# Patient Record
Sex: Male | Born: 1975 | Hispanic: No | Marital: Married | Smoking: Former smoker
Health system: Southern US, Community
[De-identification: ages and names within clinical notes are randomized; demographics above are authoritative.]

## PROBLEM LIST (undated history)

## (undated) ENCOUNTER — Ambulatory Visit: Discharge status: Absent without leave | Payer: Managed Care, Other (non HMO)

## (undated) DIAGNOSIS — E119 Type 2 diabetes mellitus without complications: Secondary | ICD-10-CM

## (undated) HISTORY — PX: KNEE ARTHROPLASTY: SHX992

---

## 2017-12-28 LAB — LIPID PANEL
Cholesterol: 197 (ref 0–200)
HDL: 39 (ref 35–70)
LDL Cholesterol: 117
Triglycerides: 207 — AB (ref 40–160)

## 2017-12-28 LAB — TSH: TSH: 4.1 (ref 0.41–5.90)

## 2017-12-28 LAB — BASIC METABOLIC PANEL
BUN: 16 (ref 4–21)
CO2: 24 — AB (ref 13–22)
Chloride: 100 (ref 99–108)
Creatinine: 0.9 (ref 0.6–1.3)
Glucose: 157
Potassium: 4.3 (ref 3.4–5.3)
Sodium: 142 (ref 137–147)

## 2017-12-28 LAB — COMPREHENSIVE METABOLIC PANEL
Albumin: 4.7 (ref 3.5–5.0)
Calcium: 9.4 (ref 8.7–10.7)
GFR calc non Af Amer: 104
Globulin: 2.3

## 2017-12-28 LAB — HEPATIC FUNCTION PANEL
ALT: 26 (ref 10–40)
AST: 16 (ref 14–40)
Alkaline Phosphatase: 68 (ref 25–125)
Bilirubin, Total: 0.8

## 2017-12-28 LAB — CBC AND DIFFERENTIAL
HCT: 48 (ref 41–53)
Hemoglobin: 16 (ref 13.5–17.5)
Platelets: 235 (ref 150–399)
WBC: 5.9

## 2017-12-28 LAB — CBC: RBC: 5.43 — AB (ref 3.87–5.11)

## 2017-12-28 LAB — TESTOSTERONE: Testosterone: 293

## 2019-12-13 LAB — HEMOGLOBIN A1C: Hemoglobin A1C: 11.3

## 2020-06-28 ENCOUNTER — Ambulatory Visit: Payer: Managed Care, Other (non HMO) | Admitting: Family Medicine

## 2020-06-28 ENCOUNTER — Other Ambulatory Visit: Payer: Self-pay

## 2020-06-28 ENCOUNTER — Other Ambulatory Visit: Payer: Self-pay | Admitting: Family Medicine

## 2020-06-28 ENCOUNTER — Encounter: Payer: Self-pay | Admitting: Family Medicine

## 2020-06-28 VITALS — BP 133/84 | HR 99 | Ht 68.0 in | Wt 214.8 lb

## 2020-06-28 DIAGNOSIS — E1169 Type 2 diabetes mellitus with other specified complication: Secondary | ICD-10-CM | POA: Diagnosis not present

## 2020-06-28 DIAGNOSIS — Z7689 Persons encountering health services in other specified circumstances: Secondary | ICD-10-CM | POA: Diagnosis not present

## 2020-06-28 DIAGNOSIS — E669 Obesity, unspecified: Secondary | ICD-10-CM | POA: Insufficient documentation

## 2020-06-28 DIAGNOSIS — E785 Hyperlipidemia, unspecified: Secondary | ICD-10-CM | POA: Diagnosis not present

## 2020-06-28 DIAGNOSIS — E119 Type 2 diabetes mellitus without complications: Secondary | ICD-10-CM | POA: Insufficient documentation

## 2020-06-28 MED ORDER — TRULICITY 0.75 MG/0.5ML ~~LOC~~ SOAJ: 0.7500 mg | SUBCUTANEOUS | 1 refills | Status: DC

## 2020-06-28 NOTE — Assessment & Plan Note (Signed)
History of elevated lipids, on statin temporarily Request records No recent lab  Plan: Encourage improved lifestyle - low carb/cholesterol, reduce portion size, continue improving regular exercise  F/u labs after review records in future

## 2020-06-28 NOTE — Assessment & Plan Note (Signed)
History of variable controlled DM, recent CBGs moderately controlled No recent T6L Complications - hyperlipidemia, obesity  Plan:  1. NEW start GLP1 Trulicity 4.65KP weekly inj - sample x2 pens today coupon copay card, new rx sent 90 day to pharmacy. Goal for wt loss appetite suppression as well 2. Continue Jardiance 26m existing med for now for first few days, if fasting sugars lower can phase off jardiance 3. Keep metformin XR 10055mBID or 2000 daily - adjust dose in future 4. Encourage improved lifestyle - low carb, low sugar diet, reduce portion size, continue improving regular exercise 5. Check CBG , bring log to next visit for review Future urine microalbumin foot exam, diabetes eye exam refer

## 2020-06-28 NOTE — Telephone Encounter (Signed)
Requested medication (s) are due for refill today: Yes  Requested medication (s) are on the active medication list: Yes  Last refill:  06/28/20  Future visit scheduled: Yes  Notes to clinic:  See highlighted notes from pharmacy     Requested Prescriptions  Pending Prescriptions Disp Refills   [place] 2 MG PEN [Pharmacy Med Name: BYDUREON 2 MG PEN INJECT]  0      Endocrinology:  Diabetes - GLP-1 Receptor Agonists - exenatide Failed - 06/28/2020  4:26 PM      Failed - HBA1C is between 0 and 7.9 and within 180 days    No results found for: HGBA1C, LABA1C        Failed - Cr in normal range and within 360 days    No results found for: CREATININE, LABCREAU, LABCREA, POCCRE        Failed - eGFR in normal range and within 360 days    No results found for: GFRAA, GFRNONAA, GFR, EGFR        Passed - Valid encounter within last 6 months    Recent Outpatient Visits           Today Type 2 diabetes mellitus with other specified complication, without long-term current use of insulin ([place])   [facility] Parks Ranger, Devonne Doughty, DO       Future Appointments             In 3 months Parks Ranger, Devonne Doughty, [facility], [facility]

## 2020-06-28 NOTE — Patient Instructions (Addendum)
Thank you for coming to the office today.  New rx sample Trulicity 6.15HI once weekly, use 2 samples, can stay out for 14 days at room temp  Disposal in sharps container.  New rx sent to pharmacy for 90 day supply, use the coupon in the sample to activate it for discount  Benefits are - reduced appetite, weight loss, sugar control, and cardiovascular protection  Can switch to ozempic if need  After 2-4 days can consider stopping Jardiance if sugars are doing good, goal is <150 fasting AM sugar.  May need jardiance for longer, until adjusted.  Eventually can reduce meds and keep the weekly injection.   Please schedule a Follow-up Appointment to: Return in about 3 months (around 09/27/2020) for 3 month follow-up DM A1c,Urine Microalbumin Foot exam, med adjust.  If you have any other questions or concerns, please feel free to call the office or send a message through [place]. You may also schedule an earlier appointment if necessary.  Additionally, you may be receiving a survey about your experience at our office within a few days to 1 week by e-mail or mail. We value your feedback.  Nobie Putnam, DO [place]

## 2020-06-28 NOTE — Progress Notes (Signed)
Subjective:    Patient ID: Cory Horne, male    DOB: [birth year], 45 y.o.   MRN: [id number]  Cory Horne is a 45 y.o. male presenting on 06/28/2020 for [place] and Diabetes  Moved from [place]. He still works in [employer]. Previous doctor in [place]. [employer].  HPI   CHRONIC DM, Type 2: Reports prior diagnosis over past few years. He has not been able to manage it. He says in past was diet controlled and then was out of control, had been to [place] before intermittently and not always adhering to lifestyle. - He developed blurry vision then went to doctor, A1c in past >11 reported, he was started on Farxiga in past, then it was switched to Jardiance and Metformin was added. Overall has improved A1c. CBGs: Avg 130-180, checks CBG occasionally Meds: Jardiance 75m daily, Metformin XR 5052mx 2 = 100034mID Reports good compliance. Tolerating well w/o side-effects Currently not on ACEi / ARB Lifestyle: - Diet (admits problem with inc carb intake and portions)  - Exercise (no regular exercise except regular walking at work, also very active at home) Denies hypoglycemia, polyuria, visual changes, numbness or tingling.   Hyperlipidemia History of mild elevated cholesterol in past. He was on rx Statin medication in past but has come off of this.  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   Depression screen PHQ 2/9 06/28/2020  Decreased Interest 0  Down, Depressed, Hopeless 0  PHQ - 2 Score 0    History reviewed. No pertinent past medical history. History reviewed. No pertinent surgical history. Social History   Socioeconomic History  . Marital status: Not on file    Spouse name: Not on file  . Number of children: Not on file  . Years of education: Not on file  . Highest education level: Not on file  Occupational History  . Not on file  Tobacco Use  . Smoking status: Former Smoker    Types: Cigarettes    Quit date: 2018    Years since  quitting: 4.2  . Smokeless tobacco: Never Used  Substance and Sexual Activity  . Alcohol use: Not Currently  . Drug use: Not on file  . Sexual activity: Not on file  Other Topics Concern  . Not on file  Social History Narrative  . Not on file   Social Determinants of Health   Financial Resource Strain: Not on file  Food Insecurity: Not on file  Transportation Needs: Not on file  Physical Activity: Not on file  Stress: Not on file  Social Connections: Not on file  Intimate Partner Violence: Not on file   History reviewed. No pertinent family history. Current Outpatient Medications on File Prior to Visit  Medication Sig  . JARDIANCE 25 MG TABS tablet Take 25 mg by mouth daily.  . metFORMIN (GLUCOPHAGE-XR) 500 MG 24 hr tablet Take 1,000 mg by mouth 2 (two) times daily.   No current facility-administered medications on file prior to visit.    Review of Systems Per HPI unless specifically indicated above      Objective:    BP 133/84   Pulse 99   Ht 5' 8"  (1.727 m)   Wt 214 lb 12.8 oz (97.4 kg)   SpO2 98%   BMI 32.66 kg/m   Wt Readings from Last 3 Encounters:  06/28/20 214 lb 12.8 oz (97.4 kg)    Physical Exam Vitals and nursing note reviewed.  Constitutional:  General: He is not in acute distress.    Appearance: He is well-developed. He is not diaphoretic.     Comments: Well-appearing, comfortable, cooperative  HENT:     Head: Normocephalic and atraumatic.  Eyes:     General:        Right eye: No discharge.        Left eye: No discharge.     Conjunctiva/sclera: Conjunctivae normal.  Cardiovascular:     Rate and Rhythm: Normal rate.  Pulmonary:     Effort: Pulmonary effort is normal.  Skin:    General: Skin is warm and dry.     Findings: No erythema or rash.  Neurological:     Mental Status: He is alert and oriented to person, place, and time.  Psychiatric:        Behavior: Behavior normal.     Comments: Well groomed, good eye contact, normal  speech and thoughts    No results found for this or any previous visit.    Assessment & Plan:   Problem List Items Addressed This Visit    Obesity (BMI 30.0-34.9)   Hyperlipidemia associated with type 2 diabetes mellitus ([place])    History of elevated lipids, on statin temporarily Request records No recent lab  Plan: Encourage improved lifestyle - low carb/cholesterol, reduce portion size, continue improving regular exercise  F/u labs after review records in future      Relevant Medications   JARDIANCE 25 MG TABS tablet   metFORMIN (GLUCOPHAGE-XR) 500 MG 24 hr tablet   TRULICITY 6.65 LD/3.5TS SOPN   Diabetes mellitus ([place]) - Primary    History of variable controlled DM, recent CBGs moderately controlled No recent V7B Complications - hyperlipidemia, obesity  Plan:  1. NEW start GLP1 Trulicity 9.39QZ weekly inj - sample x2 pens today coupon copay card, new rx sent 90 day to pharmacy. Goal for wt loss appetite suppression as well 2. Continue Jardiance 28m existing med for now for first few days, if fasting sugars lower can phase off jardiance 3. Keep metformin XR 10062mBID or 2000 daily - adjust dose in future 4. Encourage improved lifestyle - low carb, low sugar diet, reduce portion size, continue improving regular exercise 5. Check CBG , bring log to next visit for review Future urine microalbumin foot exam, diabetes eye exam refer      Relevant Medications   JARDIANCE 25 MG TABS tablet   metFORMIN (GLUCOPHAGE-XR) 500 MG 24 hr tablet   TRULICITY 0.0.09GQZ/3.0QTOPN    Other Visit Diagnoses    Encounter to establish care with new doctor            Meds ordered this encounter  Medications  . TRULICITY 0.6.22GQJ/3.3LKOPN    Sig: Inject 0.75 mg into the skin once a week.    Dispense:  6 mL    Refill:  1      Follow up plan: Return in about 3 months (around 09/27/2020) for 3 month follow-up DM A1c,Urine Microalbumin Foot exam, med adjust.  AlNobie Putnam DO [facility] 06/28/2020, 3:25 PM

## 2020-07-02 ENCOUNTER — Other Ambulatory Visit: Payer: Self-pay | Admitting: Family Medicine

## 2020-07-02 DIAGNOSIS — E1169 Type 2 diabetes mellitus with other specified complication: Secondary | ICD-10-CM

## 2020-07-02 MED ORDER — BYDUREON BCISE 2 MG/0.85ML ~~LOC~~ AUIJ: 2.0000 mg | AUTO-INJECTOR | SUBCUTANEOUS | 3 refills | Status: DC

## 2020-07-02 NOTE — Telephone Encounter (Signed)
Pharmacy comment: Alternative Requested:THE PRESCRIBED MEDICATION IS NOT COVERED BY INSURANCE. PLEASE CONSIDER CHANGING TO ONE OF THE SUGGESTED COVERED ALTERNATIVES OR SUBMITTING FOR A PRIOR AUTHORIZATION   Routing to provider

## 2020-07-03 ENCOUNTER — Other Ambulatory Visit: Payer: Self-pay | Admitting: Family Medicine

## 2020-07-03 DIAGNOSIS — E1169 Type 2 diabetes mellitus with other specified complication: Secondary | ICD-10-CM

## 2020-07-03 NOTE — Telephone Encounter (Signed)
PA was completed and approved for the requested medication

## 2020-07-03 NOTE — Telephone Encounter (Signed)
Yes, I called him and he said that the pharmacy had just been in contact with him about the approval.. All good!

## 2020-07-03 NOTE — Telephone Encounter (Signed)
Originally rx Trulicity 6.07PX weekly on 06/28/20, it was declined by pharmacy since not preferred and requested switch to alternate or PA.  I switched to Bydureon because it was listed as alternate.  Now pharmacy sends back the Bydureon says it is not preferred either.  They recommend Trulicity as preferred.  They will need a Prior Authorization to proceed with Trulicity.  ____________________________________________________ Additional Rx Information (May be used for Prior Authorization if required)  Medication name and Strength: Trulicity 1.06YI  1. Primary Diagnosis and ICD10 Code: Type 2 Diabetes (E11.69) 2. Secondary Diagnosis and ICD10 Code: n/a 3. Previous Failed Medications (Duration or Start Date) a. Metformin XR 1029m BID (2019) b. Jardiance 247mdaily (2020) 4. Quantity and Duration of New Medication: 108m87mor 30 days 5. Additional Supporting Information: a. Lab result A1c >11% ___________________________________________  AleNobie PutnamO [facility] 07/03/2020, 12:12 PM

## 2020-07-03 NOTE — Telephone Encounter (Signed)
Requested medication (s) are due for refill today:   Ordered this today by Dr. Parks Ranger  Requested medication (s) are on the active medication list:   Yes  Future visit scheduled:   Yes   Last ordered: Today 07/03/2020    Pharmacy requesting an alternate for Bydureon or submitting a PA for it.   Requested Prescriptions  Pending Prescriptions Disp Refills   BYDUREON 2 MG PEN [Pharmacy Med Name: BYDUREON 2 MG PEN INJECT]  0      Endocrinology:  Diabetes - GLP-1 Receptor Agonists - exenatide Failed - 07/03/2020 12:40 PM      Failed - HBA1C is between 0 and 7.9 and within 180 days    No results found for: HGBA1C, LABA1C        Failed - Cr in normal range and within 360 days    No results found for: CREATININE, LABCREAU, LABCREA, POCCRE        Failed - eGFR in normal range and within 360 days    No results found for: GFRAA, GFRNONAA, GFR, EGFR        Passed - Valid encounter within last 6 months    Recent Outpatient Visits           5 days ago Type 2 diabetes mellitus with other specified complication, without long-term current use of insulin ([place])   [facility] Parks Ranger, Devonne Doughty, DO       Future Appointments             In 3 months Parks Ranger, Devonne Doughty, [facility], [facility]

## 2020-07-04 ENCOUNTER — Encounter: Payer: Self-pay | Admitting: Family Medicine

## 2020-08-04 ENCOUNTER — Emergency Department: Payer: Managed Care, Other (non HMO)

## 2020-08-04 ENCOUNTER — Other Ambulatory Visit: Payer: Self-pay

## 2020-08-04 ENCOUNTER — Emergency Department
Admission: EM | Admit: 2020-08-04 | Discharge: 2020-08-04 | Disposition: A | Discharge status: Home / Self Care | Payer: Managed Care, Other (non HMO) | Attending: Emergency Medicine | Admitting: Emergency Medicine

## 2020-08-04 DIAGNOSIS — Y92410 Unspecified street and highway as the place of occurrence of the external cause: Secondary | ICD-10-CM | POA: Insufficient documentation

## 2020-08-04 DIAGNOSIS — Z96659 Presence of unspecified artificial knee joint: Secondary | ICD-10-CM | POA: Insufficient documentation

## 2020-08-04 DIAGNOSIS — M25511 Pain in right shoulder: Secondary | ICD-10-CM | POA: Diagnosis not present

## 2020-08-04 DIAGNOSIS — R0789 Other chest pain: Secondary | ICD-10-CM | POA: Insufficient documentation

## 2020-08-04 DIAGNOSIS — M79672 Pain in left foot: Secondary | ICD-10-CM | POA: Insufficient documentation

## 2020-08-04 DIAGNOSIS — Z7984 Long term (current) use of oral hypoglycemic drugs: Secondary | ICD-10-CM | POA: Insufficient documentation

## 2020-08-04 DIAGNOSIS — Z87891 Personal history of nicotine dependence: Secondary | ICD-10-CM | POA: Insufficient documentation

## 2020-08-04 DIAGNOSIS — E1169 Type 2 diabetes mellitus with other specified complication: Secondary | ICD-10-CM | POA: Diagnosis not present

## 2020-08-04 DIAGNOSIS — E785 Hyperlipidemia, unspecified: Secondary | ICD-10-CM | POA: Insufficient documentation

## 2020-08-04 DIAGNOSIS — M898X1 Other specified disorders of bone, shoulder: Secondary | ICD-10-CM

## 2020-08-04 DIAGNOSIS — S52572A Other intraarticular fracture of lower end of left radius, initial encounter for closed fracture: Secondary | ICD-10-CM | POA: Diagnosis not present

## 2020-08-04 DIAGNOSIS — S6992XA Unspecified injury of left wrist, hand and finger(s), initial encounter: Secondary | ICD-10-CM | POA: Diagnosis present

## 2020-08-04 HISTORY — DX: Type 2 diabetes mellitus without complications: E11.9

## 2020-08-04 MED ORDER — HYDROCODONE-ACETAMINOPHEN 5-325 MG PO TABS
1.0000 | ORAL_TABLET | Freq: Once | ORAL | Status: AC
  Administered 2020-08-04: 1 via ORAL
  Filled 2020-08-04: qty 1

## 2020-08-04 MED ORDER — HYDROCODONE-ACETAMINOPHEN 5-325 MG PO TABS: 1.0000 | ORAL_TABLET | Freq: Four times a day (QID) | ORAL | 0 refills | Status: AC | PRN

## 2020-08-04 MED ORDER — MELOXICAM 15 MG PO TABS: 15.0000 mg | ORAL_TABLET | Freq: Every day | ORAL | 0 refills | Status: AC

## 2020-08-04 MED ORDER — ACETAMINOPHEN 325 MG PO TABS
650.0000 mg | ORAL_TABLET | Freq: Once | ORAL | Status: AC
  Administered 2020-08-04: 650 mg via ORAL
  Filled 2020-08-04: qty 2

## 2020-08-04 MED ORDER — MELOXICAM 7.5 MG PO TABS
15.0000 mg | ORAL_TABLET | Freq: Once | ORAL | Status: AC
  Administered 2020-08-04: 15 mg via ORAL
  Filled 2020-08-04: qty 2

## 2020-08-04 NOTE — Discharge Instructions (Addendum)
Please follow-up with orthopedics regarding your left foot injury and left wrist injury.  You have been prescribed Mobic, an anti-inflammatory to take once daily.  You may take Tylenol, 650 mg every 6 hours.  You have also been prescribed Norco, which is a combination of hydrocodone and Tylenol that is safe to take in addition to your Mobic and regularly scheduled Tylenol for breakthrough pain.  Return to the ER if you have any worsening.
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
## 2020-10-11 ENCOUNTER — Other Ambulatory Visit: Payer: Self-pay

## 2020-10-11 ENCOUNTER — Encounter: Payer: Self-pay | Admitting: Family Medicine

## 2020-10-11 ENCOUNTER — Ambulatory Visit: Payer: Managed Care, Other (non HMO) | Admitting: Family Medicine

## 2020-10-11 VITALS — BP 111/74 | HR 81 | Ht 68.0 in | Wt 205.0 lb

## 2020-10-11 DIAGNOSIS — E1169 Type 2 diabetes mellitus with other specified complication: Secondary | ICD-10-CM

## 2020-10-11 DIAGNOSIS — N469 Male infertility, unspecified: Secondary | ICD-10-CM

## 2020-10-11 LAB — POCT UA - MICROALBUMIN: Microalbumin Ur, POC: 20 mg/L

## 2020-10-11 LAB — POCT GLYCOSYLATED HEMOGLOBIN (HGB A1C): Hemoglobin A1C: 8.7 % — AB (ref 4.0–5.6)

## 2020-10-11 MED ORDER — TRULICITY 1.5 MG/0.5ML ~~LOC~~ SOAJ: 1.5000 mg | SUBCUTANEOUS | 1 refills | Status: DC

## 2020-10-11 MED ORDER — TRULICITY 1.5 MG/0.5ML ~~LOC~~ SOAJ: 1.5000 mg | SUBCUTANEOUS | 3 refills | Status: DC

## 2020-10-11 NOTE — Patient Instructions (Addendum)
Thank you for coming to the office today.   [facility] Address: [address], [place], [place] [address]  Phone: [phone]  Try to return to [facility] Doctor we need copy of yearly report.  Recent Labs    12/13/19 0000 10/11/20 1428  HGBA1C 11.3 8.7*     [place] -[address]st [address] [place],  [place]  [address] Phone: [phone]  Stay tuned - they can do semen analysis test, but cannot manage infertility, if there is a problem they can recommend other Infertility Clinics.  As discussed your wife would need to see a women's health specialist for Infertility as well that will be very important  Start new higher dose Trulicity 6.1WE weekly dose, and this should help, and you can phase off and stop the Metformin now. Keep on the jardiance.   Please schedule a Follow-up Appointment to: Return in about 3 months (around 01/11/2021) for 3 month follow-up DM A1c.  If you have any other questions or concerns, please feel free to call the office or send a message through [place]. You may also schedule an earlier appointment if necessary.  Additionally, you may be receiving a survey about your experience at our office within a few days to 1 week by e-mail or mail. We value your feedback.  Nobie Putnam, DO [facility], [place].

## 2020-10-11 NOTE — Progress Notes (Signed)
Subjective:    Patient ID: Cory Horne, male    DOB: 1975/09/28, 45 y.o.   MRN: [id number]  Cory Horne is a 45 y.o. male presenting on 10/11/2020 for Diabetes   HPI  CHRONIC DM, Type 2:  prior diagnosis over past few years. He has not been able to manage it. He says in past was diet controlled and then was out of control, had been to [place] before intermittently and not always adhering to lifestyle. - He developed blurry vision then went to doctor, A1c in past >11 reported, he was started on Farxiga in past, then it was switched to Jardiance and Metformin was added. Overall has improved A1c.   Recent history since last visit Trulicity was added for better control, and he came off Sunset. Had blurry vision R eye, later came back on Jardiance and vision resolved.  Meds: Trulicity 7.82NF weekly, Jardiance 52m daily, Metformin XR 5074mx 2 = 100023mID Reports good compliance. Tolerating well w/o side-effects Currently not on ACEi / ARB Lifestyle: - Diet (improving diet) - Exercise (no regular exercise except regular walking at work, also very active at home)  Improved CBG and symptoms He does not have the polydipsia or other issues from diabetes.  [facility] He has been trying to conceive child w/ wife for 3.5 years, he has had prior children with other partner in past. He asks about testing for male infertility   [facility] 10/11/2020 06/28/2020  Decreased Interest 0 0  Down, Depressed, Hopeless 0 0  PHQ - 2 Score 0 0  Altered sleeping 0 -  Tired, decreased energy 0 -  Change in appetite 0 -  Feeling bad or failure about yourself  0 -  Trouble concentrating 0 -  Moving slowly or fidgety/restless 0 -  Suicidal thoughts 0 -  PHQ-9 Score 0 -  Difficult doing work/chores Not difficult at all -    Social History   Tobacco Use   Smoking status: Former    Packs/day: 2.00    Years: 26.00    Pack years: 52.00    Types:  Cigarettes    Quit date: 2018    Years since quitting: 4.5   Smokeless tobacco: Never  Substance Use Topics   Alcohol use: Not Currently    Review of Systems Per HPI unless specifically indicated above     Objective:    BP 111/74   Pulse 81   Ht 5' 8"  (1.76.213   Wt 205 lb (93 kg)   SpO2 95%   BMI 31.17 kg/m   Wt Readings from Last 3 Encounters:  10/11/20 205 lb (93 kg)  08/04/20 205 lb (93 kg)  06/28/20 214 lb 12.8 oz (97.4 kg)    Physical Exam Vitals and nursing note reviewed.  Constitutional:      General: He is not in acute distress.    Appearance: He is well-developed. He is not diaphoretic.     Comments: Well-appearing, comfortable, cooperative  HENT:     Head: Normocephalic and atraumatic.  Eyes:     General:        Right eye: No discharge.        Left eye: No discharge.     Conjunctiva/sclera: Conjunctivae normal.  Neck:     Thyroid: No thyromegaly.  Cardiovascular:     Rate and Rhythm: Normal rate and regular rhythm.     Pulses: Normal pulses.     Heart sounds:  Normal heart sounds. No murmur heard. Pulmonary:     Effort: Pulmonary effort is normal. No respiratory distress.     Breath sounds: Normal breath sounds. No wheezing or rales.  Musculoskeletal:        General: Normal range of motion.     Cervical back: Normal range of motion and neck supple.  Lymphadenopathy:     Cervical: No cervical adenopathy.  Skin:    General: Skin is warm and dry.     Findings: No erythema or rash.  Neurological:     Mental Status: He is alert and oriented to person, place, and time. Mental status is at baseline.  Psychiatric:        Behavior: Behavior normal.     Comments: Well groomed, good eye contact, normal speech and thoughts    Diabetic Foot Exam - Simple   Simple Foot Form Diabetic Foot exam was performed with the following findings: Yes 10/11/2020  2:48 PM  Visual Inspection No deformities, no ulcerations, no other skin breakdown bilaterally:  Yes Sensation Testing Intact to touch and monofilament testing bilaterally: Yes Pulse Check Posterior Tibialis and Dorsalis pulse intact bilaterally: Yes Comments    Recent Labs    12/13/19 0000 10/11/20 1428  HGBA1C 11.3 8.7*     Results for orders placed or performed in visit on 10/11/20  POCT HgB A1C  Result Value Ref Range   Hemoglobin A1C 8.7 (A) 4.0 - 5.6 %  POCT UA - Microalbumin  Result Value Ref Range   Microalbumin Ur, POC 20 mg/L   Creatinine, POC     Albumin/Creatinine Ratio, Urine, POC        Assessment & Plan:   Problem List Items Addressed This Visit     Diabetes mellitus ([place]) - Primary    Improved A1c to 8.7 from 11 On GLP1 Complications - hyperlipidemia, obesity, blurry vision  Plan:  1. Increase dose GLP1 Trulicity from 2.59 weekly to 1.13m weekly new rx sent. - Continue Jardiance 250mdaily, REDUCE Metformin XR 100021maily taper off 2. Encourage improved lifestyle - low carb, low sugar diet, reduce portion size, continue improving regular exercise Check CBG , bring log to next visit for review Urine microalbumin, negative DM Foot [facility]       Relevant Medications   TRULICITY 1.5 MG/DG/3.8VFPN   Other Relevant Orders   POCT HgB A1C (Completed)   POCT UA - Microalbumin (Completed)   Other Visit Diagnoses     Infertility male       Relevant Orders   Ambulatory referral to Urology        #Infertility concern  Most likely his wife may have infertility issue, as he has fathered several children in past. They have tried for 3.5 years to conceive a child and have been unsuccessful. He is interested in fertility options, and would like to be tested. I offered basic semen analysis order here through [facility], however he would like to discuss it further with someone else. Gave him printed options for Fertility Clinics he should check insurance coverage etc for his Wife, however for male infertility again advised him  he could speak with local Urologist first to do semen testing and determine where to go next as they do not necessarily do any fertility interventions.  Meds ordered this encounter  Medications   DISCONTD: TRULICITY 1.5 MG/IE/3.3IRPN    Sig: Inject 1.5 mg into the skin once a week.    Dispense:  2 mL    Refill:  3   TRULICITY 1.5 TK/1.6WF SOPN    Sig: Inject 1.5 mg into the skin once a week.    Dispense:  6 mL    Refill:  1    Please do 90 day rx, or divide it out monthly however needs to be handled.      Follow up plan: Return in about 3 months (around 01/11/2021) for 3 month follow-up DM A1c.   Nobie Putnam, [facility] 10/11/2020, 2:26 PM

## 2020-10-12 NOTE — Assessment & Plan Note (Signed)
Improved A1c to 8.7 from 11 On GLP1 Complications - hyperlipidemia, obesity, blurry vision  Plan:  1. Increase dose GLP1 Trulicity from 2.53 weekly to 1.54m weekly new rx sent. - Continue Jardiance 234mdaily, REDUCE Metformin XR 100031maily taper off 2. Encourage improved lifestyle - low carb, low sugar diet, reduce portion size, continue improving regular exercise Check CBG , bring log to next visit for review Urine microalbumin, negative DM Foot [facility]

## 2020-10-24 ENCOUNTER — Other Ambulatory Visit: Payer: Self-pay | Admitting: Family Medicine

## 2020-10-24 DIAGNOSIS — E1169 Type 2 diabetes mellitus with other specified complication: Secondary | ICD-10-CM

## 2020-10-26 ENCOUNTER — Other Ambulatory Visit: Payer: Self-pay | Admitting: Family Medicine

## 2020-10-26 DIAGNOSIS — E1169 Type 2 diabetes mellitus with other specified complication: Secondary | ICD-10-CM

## 2020-10-26 NOTE — Telephone Encounter (Signed)
dc'd 10/11/20 Dose changed Dr. Parks Ranger

## 2020-10-27 ENCOUNTER — Other Ambulatory Visit: Payer: Self-pay | Admitting: Family Medicine

## 2020-10-27 DIAGNOSIS — E1169 Type 2 diabetes mellitus with other specified complication: Secondary | ICD-10-CM

## 2020-10-27 NOTE — Telephone Encounter (Signed)
last RF 10/11/20 dose changed with Dr Parks Ranger

## 2020-10-27 NOTE — Telephone Encounter (Signed)
dc'd 10/11/20 Dose changed Dr Parks Ranger

## 2020-10-31 ENCOUNTER — Encounter: Payer: Self-pay | Admitting: Family Medicine

## 2020-10-31 ENCOUNTER — Other Ambulatory Visit: Payer: Self-pay

## 2020-10-31 DIAGNOSIS — E1169 Type 2 diabetes mellitus with other specified complication: Secondary | ICD-10-CM

## 2020-10-31 MED ORDER — TRULICITY 1.5 MG/0.5ML ~~LOC~~ SOAJ: 1.5000 mg | SUBCUTANEOUS | 1 refills | Status: DC

## 2020-11-08 ENCOUNTER — Telehealth: Payer: Self-pay | Admitting: Family Medicine

## 2020-11-08 ENCOUNTER — Encounter: Payer: Self-pay | Admitting: Family Medicine

## 2020-11-08 NOTE — Telephone Encounter (Signed)
Additional information needed for his PA  Mitzi Hansen from [facility] called requesting this  [place] also is requesting an emergency supply for Trulicity   [id number] [id number] Phone: [phone]: [phone]

## 2020-11-15 ENCOUNTER — Other Ambulatory Visit: Payer: Self-pay

## 2020-11-15 DIAGNOSIS — E1169 Type 2 diabetes mellitus with other specified complication: Secondary | ICD-10-CM

## 2020-11-15 MED ORDER — TRULICITY 1.5 MG/0.5ML ~~LOC~~ SOAJ: 1.5000 mg | SUBCUTANEOUS | 1 refills | Status: DC

## 2020-11-21 ENCOUNTER — Encounter: Payer: Self-pay | Admitting: Family Medicine

## 2020-12-03 NOTE — Telephone Encounter (Signed)
Cory Horne called reporting that they need a new Prior Authorization for Trulicity 1.5 MG by 0.5 ML Pen  Since there is already an approved PA, she says it will be much easier to call their [place] contact [phone] (PA dept.)  Fax: [phone] OR verbal PA you can call [phone]

## 2020-12-04 NOTE — Telephone Encounter (Signed)
PA has been approved as of 12/04/20.

## 2020-12-04 NOTE — Telephone Encounter (Signed)
I have resubmitted a PA online.

## 2020-12-09 ENCOUNTER — Encounter: Payer: Self-pay | Admitting: Family Medicine

## 2020-12-09 DIAGNOSIS — E1169 Type 2 diabetes mellitus with other specified complication: Secondary | ICD-10-CM

## 2020-12-10 ENCOUNTER — Other Ambulatory Visit: Payer: Self-pay

## 2020-12-10 DIAGNOSIS — E1169 Type 2 diabetes mellitus with other specified complication: Secondary | ICD-10-CM

## 2020-12-10 MED ORDER — TRULICITY 1.5 MG/0.5ML ~~LOC~~ SOAJ: 1.5000 mg | SUBCUTANEOUS | 3 refills | Status: DC

## 2021-01-17 ENCOUNTER — Ambulatory Visit: Payer: Managed Care, Other (non HMO) | Admitting: Family Medicine

## 2021-02-04 ENCOUNTER — Encounter: Payer: Self-pay | Admitting: Family Medicine

## 2021-02-04 DIAGNOSIS — E1169 Type 2 diabetes mellitus with other specified complication: Secondary | ICD-10-CM

## 2021-02-04 MED ORDER — JARDIANCE 25 MG PO TABS: 25.0000 mg | ORAL_TABLET | Freq: Every day | ORAL | 1 refills | Status: DC

## 2021-02-14 ENCOUNTER — Encounter: Payer: Self-pay | Admitting: Family Medicine

## 2021-02-14 ENCOUNTER — Ambulatory Visit: Payer: Managed Care, Other (non HMO) | Admitting: Family Medicine

## 2021-02-14 ENCOUNTER — Other Ambulatory Visit: Payer: Self-pay

## 2021-02-14 VITALS — BP 126/77 | HR 82 | Ht 68.0 in | Wt 209.4 lb

## 2021-02-14 DIAGNOSIS — E1169 Type 2 diabetes mellitus with other specified complication: Secondary | ICD-10-CM

## 2021-02-14 LAB — POCT GLYCOSYLATED HEMOGLOBIN (HGB A1C): Hemoglobin A1C: 9.4 % — AB (ref 4.0–5.6)

## 2021-02-14 NOTE — Patient Instructions (Addendum)
Thank you for coming to the office today.  Next visit will refer for Colonoscopy.  Schedule Diabetic Eye exam. - have them fax [place] a copy of the result. Fax [phone]  [facility] Address: [address], [place], [place] [address]  Phone: [phone]   DUE for FASTING BLOOD WORK (no food or drink after midnight before the lab appointment, only water or coffee without cream/sugar on the morning of)  SCHEDULE "Lab Only" visit in the morning at the clinic for lab draw in 3 MONTHS   - Make sure Lab Only appointment is at about 1 week before your next appointment, so that results will be available  For Lab Results, once available within 2-3 days of blood draw, you can can log in to MyChart online to view your results and a brief explanation. Also, we can discuss results at next follow-up visit.   Please schedule a Follow-up Appointment to: Return in about 3 months (around 05/17/2021) for 3 month Annual Physical AM apt 1st available FRIDAY only (earliest) fasting lab AFTER (refer colo).  If you have any other questions or concerns, please feel free to call the office or send a message through [place]. You may also schedule an earlier appointment if necessary.  Additionally, you may be receiving a survey about your experience at our office within a few days to 1 week by e-mail or mail. We value your feedback.  Nobie Putnam, DO [place]

## 2021-02-14 NOTE — Progress Notes (Signed)
Subjective:    Patient ID: Cory Horne, male    DOB: Nov 04, 1975, 45 y.o.   MRN: [id number]  Cory Horne is a 45 y.o. male presenting on 02/14/2021 for Diabetes   HPI  CHRONIC DM, Type 2: Trulicity 9.89QJ up to 1.9ER weekly, hard time to get it approved. He was 1 month without Trulicity. - He continued the Jardiance 53m daily and Metformin XR. - He has come off Metformin. - Now back on  Prior A1c range >8-11, due for A1c today, last was 8.7 Admits blurry vision if off med, then back on Jardiance has improved vision   Meds: Trulicity 07.40CXweekly, Jardiance 240mdaily Off metformin Reports good compliance. Tolerating well w/o side-effects Currently not on ACEi / ARB Lifestyle: - Diet (improving diet) - Exercise (no regular exercise except regular walking at work, also very active at home)    Health Maintenance:  Due for [employer] today despite counseling on benefits  Due for colon CA screening age 45+no fam history, will pursue colonoscopy at next physical.  COVID booster updated, not newest one   [facility] 10/11/2020 06/28/2020  Decreased Interest 0 0  Down, Depressed, Hopeless 0 0  PHQ - 2 Score 0 0  Altered sleeping 0 -  Tired, decreased energy 0 -  Change in appetite 0 -  Feeling bad or failure about yourself  0 -  Trouble concentrating 0 -  Moving slowly or fidgety/restless 0 -  Suicidal thoughts 0 -  PHQ-9 Score 0 -  Difficult doing work/chores Not difficult at all -    Social History   Tobacco Use   Smoking status: Former    Packs/day: 2.00    Years: 26.00    Pack years: 52.00    Types: Cigarettes    Quit date: 2018    Years since quitting: 4.8   Smokeless tobacco: Never  Substance Use Topics   Alcohol use: Not Currently    Review of Systems Per HPI unless specifically indicated above     Objective:    BP 126/77   Pulse 82   Ht 5' 8"  (1.727 m)   Wt 209 lb 6.4 oz (95 kg)   SpO2 98%   BMI 31.84 kg/m   Wt  Readings from Last 3 Encounters:  02/14/21 209 lb 6.4 oz (95 kg)  10/11/20 205 lb (93 kg)  08/04/20 205 lb (93 kg)    Physical Exam Vitals and nursing note reviewed.  Constitutional:      General: He is not in acute distress.    Appearance: He is well-developed. He is not diaphoretic.     Comments: Well-appearing, comfortable, cooperative  HENT:     Head: Normocephalic and atraumatic.  Eyes:     General:        Right eye: No discharge.        Left eye: No discharge.     Conjunctiva/sclera: Conjunctivae normal.  Neck:     Thyroid: No thyromegaly.  Cardiovascular:     Rate and Rhythm: Normal rate and regular rhythm.     Pulses: Normal pulses.     Heart sounds: Normal heart sounds. No murmur heard. Pulmonary:     Effort: Pulmonary effort is normal. No respiratory distress.     Breath sounds: Normal breath sounds. No wheezing or rales.  Musculoskeletal:        General: Normal range of motion.     Cervical back: Normal range of motion and neck supple.  Lymphadenopathy:     Cervical: No cervical adenopathy.  Skin:    General: Skin is warm and dry.     Findings: No erythema or rash.  Neurological:     Mental Status: He is alert and oriented to person, place, and time. Mental status is at baseline.  Psychiatric:        Behavior: Behavior normal.     Comments: Well groomed, good eye contact, normal speech and thoughts      Results for orders placed or performed in visit on 02/14/21  POCT glycosylated hemoglobin (Hb A1C)  Result Value Ref Range   Hemoglobin A1C 9.4 (A) 4.0 - 5.6 %      Assessment & Plan:   Problem List Items Addressed This Visit     Type 2 diabetes mellitus with other specified complication ([place]) - Primary    Elevated A1c to 9.4 now prior 8-11 range Back On GLP1, was 1 month w/o due to insurance Complications - hyperlipidemia, obesity, blurry vision  Plan:  1. Continue Trulicity 4.8JE weekly inj and Jardiance 66m daily OFF Metformin 2. Encourage  improved lifestyle - low carb, low sugar diet, reduce portion size, continue improving regular exercise Check CBG , bring log to next visit for review [facility]      Relevant Orders   POCT glycosylated hemoglobin (Hb A1C) (Completed)    No orders of the defined types were placed in this encounter.     Follow up plan: Return in about 3 months (around 05/17/2021) for 3 month Annual Physical AM apt 1st available FRIDAY only (earliest) fasting lab AFTER (refer colo).  Will place orders after next physical for CMET Lipid CBC A1c HIV Hep C Colonoscopy next time as well  ANobie Putnam DO [facility] 02/14/2021, 3:00 PM

## 2021-02-14 NOTE — Assessment & Plan Note (Signed)
Elevated A1c to 9.4 now prior 8-11 range Back On GLP1, was 1 month w/o due to insurance Complications - hyperlipidemia, obesity, blurry vision  Plan:  1. Continue Trulicity 1.6XW weekly inj and Jardiance 49m daily OFF Metformin 2. Encourage improved lifestyle - low carb, low sugar diet, reduce portion size, continue improving regular exercise Check CBG , bring log to next visit for review [facility]

## 2021-05-21 ENCOUNTER — Other Ambulatory Visit: Payer: Self-pay | Admitting: Family Medicine

## 2021-05-21 DIAGNOSIS — E1169 Type 2 diabetes mellitus with other specified complication: Secondary | ICD-10-CM

## 2021-05-21 NOTE — Telephone Encounter (Signed)
Requested medication (s) are due for refill today:   Yes  Requested medication (s) are on the active medication list:   Yes  Future visit scheduled:   Yes in 2 days   Last ordered: 12/10/2020 6 ml, 3 refills  Returned because A1C is out of range per protocol.   Requested Prescriptions  Pending Prescriptions Disp Refills   TRULICITY 1.5 ZO/1.0RU SOPN [Pharmacy Med Name: TRULICITY 1.5 EA/5.4 ML PEN]  1    Sig: Inject 1.5 mg into the skin once a week.     Endocrinology:  Diabetes - GLP-1 Receptor Agonists Failed - 05/21/2021  1:37 AM      Failed - HBA1C is between 0 and 7.9 and within 180 days    Hemoglobin A1C  Date Value Ref Range Status  02/14/2021 9.4 (A) 4.0 - 5.6 % Final  12/13/2019 11.3  Final          Passed - Valid encounter within last 6 months    Recent Outpatient Visits           3 months ago Type 2 diabetes mellitus with other specified complication, without long-term current use of insulin [facility])   [place], DO   7 months ago Type 2 diabetes mellitus with other specified complication, without long-term current use of insulin [facility])   [place], DO   10 months ago Type 2 diabetes mellitus with other specified complication, without long-term current use of insulin ([place])   [facility] Parks Ranger, Devonne Doughty, DO       Future Appointments             In 2 days Parks Ranger, Devonne Doughty, DO [facility], [facility]

## 2021-05-23 ENCOUNTER — Encounter: Payer: Self-pay | Admitting: Family Medicine

## 2021-05-23 ENCOUNTER — Ambulatory Visit (INDEPENDENT_AMBULATORY_CARE_PROVIDER_SITE_OTHER): Payer: Managed Care, Other (non HMO) | Admitting: Family Medicine

## 2021-05-23 ENCOUNTER — Other Ambulatory Visit: Payer: Self-pay

## 2021-05-23 VITALS — BP 123/80 | HR 82 | Ht 68.0 in | Wt 202.4 lb

## 2021-05-23 DIAGNOSIS — Z0000 Encounter for general adult medical examination without abnormal findings: Secondary | ICD-10-CM | POA: Diagnosis not present

## 2021-05-23 DIAGNOSIS — Z1211 Encounter for screening for malignant neoplasm of colon: Secondary | ICD-10-CM

## 2021-05-23 DIAGNOSIS — Z1159 Encounter for screening for other viral diseases: Secondary | ICD-10-CM

## 2021-05-23 DIAGNOSIS — E66811 Obesity, class 1: Secondary | ICD-10-CM

## 2021-05-23 DIAGNOSIS — E669 Obesity, unspecified: Secondary | ICD-10-CM

## 2021-05-23 DIAGNOSIS — E1169 Type 2 diabetes mellitus with other specified complication: Secondary | ICD-10-CM | POA: Diagnosis not present

## 2021-05-23 DIAGNOSIS — Z125 Encounter for screening for malignant neoplasm of prostate: Secondary | ICD-10-CM

## 2021-05-23 DIAGNOSIS — E785 Hyperlipidemia, unspecified: Secondary | ICD-10-CM

## 2021-05-23 NOTE — Patient Instructions (Addendum)
Thank you for coming to the office today.  Remember! Send a picture or copy of the Diabetic Eye Exam report - to the mychart  Discontinued Trulicity.  Keep on Jardiance 2m - let me know before you run out!  --------------   Ordered the Cologuard (home kit) test for colon cancer screening. Stay tuned for further updates.  It will be shipped to you directly. If not received in 2-4 weeks, call [place] the company.   If you send it back and no results are received in 2-4 weeks, call [place] the company as well!   Colon Cancer Screening: - For all adults age 46+routine colon cancer screening is highly recommended.     - Recent guidelines from [place] starting age of 470- Early detection of colon cancer is important, because often there are no warning signs or symptoms, also if found early usually it can be cured. Late stage is hard to treat.   - If Cologuard is NEGATIVE, then it is good for 3 years before next due - If Cologuard is POSITIVE, then it is strongly advised to get a Colonoscopy, which allows the GI doctor to locate the source of the cancer or polyp (even very early stage) and treat it by removing it. ------------------------- Follow instructions to collect sample, you may call the company for any help or questions, 24/7 telephone support at [phone]   Please schedule a Follow-up Appointment to: Return in about 6 months (around 11/20/2021) for 6 month DM A1c.  If you have any other questions or concerns, please feel free to call the office or send a message through [place] You may also schedule an earlier appointment if necessary.  Additionally, you may be receiving a survey about your experience at our office within a few days to 1 week by e-mail or mail. We value your feedback.  ANobie Putnam DO [facility]

## 2021-05-23 NOTE — Assessment & Plan Note (Signed)
History of elevated lipids, remains off statin  Plan: Encourage improved lifestyle - low carb/cholesterol, reduce portion size, continue improving regular exercise Labs today follow up results

## 2021-05-23 NOTE — Progress Notes (Signed)
Subjective:    Patient ID: Cory Horne, male    DOB: Jun 10, 1975, 46 y.o.   MRN: [id number]  Cory Horne is a 46 y.o. male presenting on 05/23/2021 for Annual Exam   HPI  Here for Annual Physical and Lab Review.  CHRONIC DM, Type 2: Updates since last visit 11/18, he changed diet to avoid all carbs, was having CBG 89 He monitors CBG day and night, avg 90-120 avg in AM, PM with 90.  Trulicity 5.10CH up to 8.5ID weekly, hard time to get it approved. He was 1 month without Trulicity. - He continued the Jardiance 35m daily and Metformin XR. - He has come off Metformin. - Now back on  Prior A1c range >8-11, due for A1c today, last was 8.7 Admits blurry vision if off med, then back on Jardiance has improved vision   Meds: Jardiance 278EUdaily Off trulicity Off metformin Reports good compliance. Tolerating well w/o side-effects Currently not on ACEi / ARB Lifestyle: - Diet (improving diet) - Exercise (no regular exercise except regular walking at work, also very active at home)   Hyperlipidemia History of mild elevated cholesterol in past. He was on rx Statin medication in past but has come off of this.   Health Maintenance:  PSA screening due today. No fam history. No prior readings.   Due for Flu Shot, declines today despite counseling on benefits   Due for colon CA screening age 46+ no fam history, will order Cologuard today.   COVID booster updated, not newest one   [facility] 05/23/2021 10/11/2020 06/28/2020  Decreased Interest 0 0 0  Down, Depressed, Hopeless 0 0 0  PHQ - 2 Score 0 0 0  Altered sleeping 0 0 -  Tired, decreased energy 0 0 -  Change in appetite 0 0 -  Feeling bad or failure about yourself  0 0 -  Trouble concentrating 0 0 -  Moving slowly or fidgety/restless 0 0 -  Suicidal thoughts 0 0 -  PHQ-9 Score 0 0 -  Difficult doing work/chores Not difficult at all Not difficult at all -   GAD 7 : Generalized Anxiety Score 05/23/2021  10/11/2020  Nervous, Anxious, on Edge 0 0  Control/stop worrying 0 0  Worry too much - different things 0 0  Trouble relaxing 0 0  Restless 0 0  Easily annoyed or irritable 0 0  Afraid - awful might happen 0 0  Total GAD 7 Score 0 0  Anxiety Difficulty Not difficult at all Not difficult at all      Past Medical History:  Diagnosis Date   Diabetes mellitus without complication (HCC)    Past Surgical History:  Procedure Laterality Date   KNEE ARTHROPLASTY     Social History   Socioeconomic History   Marital status: Married    Spouse name: Not on file   Number of children: Not on file   Years of education: high school   Highest education level: High school graduate  Occupational History   Not on file  Tobacco Use   Smoking status: Former    Packs/day: 2.00    Years: 26.00    Pack years: 52.00    Types: Cigarettes    Quit date: 2018    Years since quitting: 5.1   Smokeless tobacco: Never  Substance and Sexual Activity   Alcohol use: Not Currently   Drug use: Not on file   Sexual activity: Not on file  Other Topics Concern  Not on file  Social History Narrative   Not on file   Social Determinants of Health   Financial Resource Strain: Not on file  Food Insecurity: Not on file  Transportation Needs: Not on file  Physical Activity: Not on file  Stress: Not on file  Social Connections: Not on file  Intimate Partner Violence: Not on file   History reviewed. No pertinent family history. Current Outpatient Medications on File Prior to Visit  Medication Sig   JARDIANCE 25 MG TABS tablet Take 1 tablet (25 mg total) by mouth daily.   No current facility-administered medications on file prior to visit.    Review of Systems  Constitutional:  Negative for activity change, appetite change, chills, diaphoresis, fatigue and fever.  HENT:  Negative for congestion and hearing loss.   Eyes:  Negative for visual disturbance.  Respiratory:  Negative for cough, chest  tightness, shortness of breath and wheezing.   Cardiovascular:  Negative for chest pain, palpitations and leg swelling.  Gastrointestinal:  Negative for abdominal pain, constipation, diarrhea, nausea and vomiting.  Genitourinary:  Negative for dysuria, frequency and hematuria.  Musculoskeletal:  Negative for arthralgias and neck pain.  Skin:  Negative for rash.  Neurological:  Negative for dizziness, weakness, light-headedness, numbness and headaches.  Hematological:  Negative for adenopathy.  Psychiatric/Behavioral:  Negative for behavioral problems, dysphoric mood and sleep disturbance.   Per HPI unless specifically indicated above      Objective:    BP 123/80    Pulse 82    Ht 5' 8"  (1.727 m)    Wt 202 lb 6.4 oz (91.8 kg)    SpO2 100%    BMI 30.77 kg/m   Wt Readings from Last 3 Encounters:  05/23/21 202 lb 6.4 oz (91.8 kg)  02/14/21 209 lb 6.4 oz (95 kg)  10/11/20 205 lb (93 kg)    Physical Exam Vitals and nursing note reviewed.  Constitutional:      General: He is not in acute distress.    Appearance: He is well-developed. He is not diaphoretic.     Comments: Well-appearing, comfortable, cooperative  HENT:     Head: Normocephalic and atraumatic.  Eyes:     General:        Right eye: No discharge.        Left eye: No discharge.     Conjunctiva/sclera: Conjunctivae normal.     Pupils: Pupils are equal, round, and reactive to light.  Neck:     Thyroid: No thyromegaly.     Vascular: No carotid bruit.  Cardiovascular:     Rate and Rhythm: Normal rate and regular rhythm.     Pulses: Normal pulses.     Heart sounds: Normal heart sounds. No murmur heard. Pulmonary:     Effort: Pulmonary effort is normal. No respiratory distress.     Breath sounds: Normal breath sounds. No wheezing or rales.  Abdominal:     General: Bowel sounds are normal. There is no distension.     Palpations: Abdomen is soft. There is no mass.     Tenderness: There is no abdominal tenderness.   Musculoskeletal:        General: No tenderness. Normal range of motion.     Cervical back: Normal range of motion and neck supple.     Right lower leg: No edema.     Left lower leg: No edema.     Comments: Upper / Lower Extremities: - Normal muscle tone, strength bilateral upper extremities 5/5,  lower extremities 5/5  Lymphadenopathy:     Cervical: No cervical adenopathy.  Skin:    General: Skin is warm and dry.     Findings: No erythema or rash.  Neurological:     Mental Status: He is alert and oriented to person, place, and time.     Comments: Distal sensation intact to light touch all extremities  Psychiatric:        Mood and Affect: Mood normal.        Behavior: Behavior normal.        Thought Content: Thought content normal.     Comments: Well groomed, good eye contact, normal speech and thoughts     Recent Labs    10/11/20 1428 02/14/21 1519  HGBA1C 8.7* 9.4*    Results for orders placed or performed in visit on 02/14/21  POCT glycosylated hemoglobin (Hb A1C)  Result Value Ref Range   Hemoglobin A1C 9.4 (A) 4.0 - 5.6 %      Assessment & Plan:   Problem List Items Addressed This Visit     Type 2 diabetes mellitus with other specified complication ([place])    Dramatic improved CBG readings, suspected controlled DM now w/ lifestyle modification OFF GLP1, Metformin Complications - hyperlipidemia, obesity, blurry vision  Plan:  1. Continue Jardiance 81m daily 2. Encourage improved lifestyle - low carb, low sugar diet, reduce portion size, continue improving regular exercise Check CBG , bring log to next visit for review Request copy of [facility] exam      Relevant Orders   COMPLETE METABOLIC PANEL WITH GFR   Lipid panel   CBC with Differential/Platelet   Hemoglobin A1c   Obesity (BMI 30.0-34.9)    Improving wt with lifestyle overhaul      Hyperlipidemia associated with type 2 diabetes mellitus ([place]    History of elevated lipids, remains off  statin  Plan: Encourage improved lifestyle - low carb/cholesterol, reduce portion size, continue improving regular exercise Labs today follow up results      Relevant Orders   Lipid panel   Other Visit Diagnoses     Annual physical exam    -  Primary   Relevant Orders   COMPLETE METABOLIC PANEL WITH GFR   Lipid panel   CBC with Differential/Platelet   Hemoglobin A1c   PSA   Screening for colon cancer       Relevant Orders   Cologuard   Screening for prostate cancer       Relevant Orders   PSA   Need for hepatitis C screening test       Relevant Orders   Hepatitis C antibody       Updated Health Maintenance information Due for fasting lab today. Check initial PSA screening age 46+Encouraged improvement to lifestyle with diet and exercise Goal of weight loss   No orders of the defined types were placed in this encounter.     Follow up plan: Return in about 6 months (around 11/20/2021) for 6 month DM A1c.  ANobie Putnam [facility] 05/23/2021, 8:59 AM

## 2021-05-23 NOTE — Assessment & Plan Note (Signed)
Improving wt with lifestyle overhaul

## 2021-05-23 NOTE — Assessment & Plan Note (Signed)
Dramatic improved CBG readings, suspected controlled DM now w/ lifestyle modification OFF GLP1, Metformin Complications - hyperlipidemia, obesity, blurry vision  Plan:  1. Continue Jardiance 10m daily 2. Encourage improved lifestyle - low carb, low sugar diet, reduce portion size, continue improving regular exercise Check CBG , bring log to next visit for review [facility]

## 2021-05-26 LAB — LIPID PANEL
Cholesterol: 167 mg/dL (ref <200)
HDL: 41 mg/dL (ref >=40)
LDL Cholesterol (Calc): 101 mg/dL (calc) — ABNORMAL HIGH
Non-HDL Cholesterol (Calc): 126 mg/dL (calc) (ref <130)
Total CHOL/HDL Ratio: 4.1 (calc) (ref <5.0)
Triglycerides: 146 mg/dL (ref <150)

## 2021-05-26 LAB — CBC WITH DIFFERENTIAL/PLATELET
Absolute Monocytes: 616 cells/uL (ref 200–950)
Basophils Absolute: 22 cells/uL (ref 0–200)
Basophils Relative: 0.4 %
Eosinophils Absolute: 101 cells/uL (ref 15–500)
Eosinophils Relative: 1.8 %
HCT: 47.3 % (ref 38.5–50.0)
Hemoglobin: 16.1 g/dL (ref 13.2–17.1)
Lymphs Abs: 1753 cells/uL (ref 850–3900)
MCH: 29.8 pg (ref 27.0–33.0)
MCHC: 34 g/dL (ref 32.0–36.0)
MCV: 87.4 fL (ref 80.0–100.0)
MPV: 11.9 fL (ref 7.5–12.5)
Monocytes Relative: 11 %
Neutro Abs: 3108 cells/uL (ref 1500–7800)
Neutrophils Relative %: 55.5 %
Platelets: 183 10*3/uL (ref 140–400)
RBC: 5.41 10*6/uL (ref 4.20–5.80)
RDW: 13.1 % (ref 11.0–15.0)
Total Lymphocyte: 31.3 %
WBC: 5.6 10*3/uL (ref 3.8–10.8)

## 2021-05-26 LAB — COMPLETE METABOLIC PANEL WITH GFR
AG Ratio: 1.8 (calc) (ref 1.0–2.5)
ALT: 32 U/L (ref 9–46)
AST: 21 U/L (ref 10–40)
Albumin: 4.6 g/dL (ref 3.6–5.1)
Alkaline phosphatase (APISO): 56 U/L (ref 36–130)
BUN: 19 mg/dL (ref 7–25)
CO2: 25 mmol/L (ref 20–32)
Calcium: 9.6 mg/dL (ref 8.6–10.3)
Chloride: 101 mmol/L (ref 98–110)
Creat: 0.83 mg/dL (ref 0.60–1.29)
Globulin: 2.6 g/dL (calc) (ref 1.9–3.7)
Glucose, Bld: 104 mg/dL — ABNORMAL HIGH (ref 65–99)
Potassium: 4.1 mmol/L (ref 3.5–5.3)
Sodium: 137 mmol/L (ref 135–146)
Total Bilirubin: 1.1 mg/dL (ref 0.2–1.2)
Total Protein: 7.2 g/dL (ref 6.1–8.1)
eGFR: 110 mL/min/{1.73_m2} (ref >=60)

## 2021-05-26 LAB — HEMOGLOBIN A1C
Hgb A1c MFr Bld: 6.8 % of total Hgb — ABNORMAL HIGH (ref <5.7)
Mean Plasma Glucose: 148 mg/dL
eAG (mmol/L): 8.2 mmol/L

## 2021-05-26 LAB — HEPATITIS C ANTIBODY
Hepatitis C Ab: NONREACTIVE
SIGNAL TO CUT-OFF: 0.02 (ref <1.00)

## 2021-05-26 LAB — PSA: PSA: 0.63 ng/mL (ref <=4.00)

## 2021-06-05 LAB — COLOGUARD: COLOGUARD: NEGATIVE

## 2021-08-04 ENCOUNTER — Other Ambulatory Visit: Payer: Self-pay | Admitting: Family Medicine

## 2021-08-04 DIAGNOSIS — E1169 Type 2 diabetes mellitus with other specified complication: Secondary | ICD-10-CM

## 2021-08-05 NOTE — Telephone Encounter (Signed)
Requested Prescriptions  ?Pending Prescriptions Disp Refills  ?? JARDIANCE 25 MG TABS tablet [Pharmacy Med Name: JARDIANCE 25 MG TABLET] 90 tablet 1  ?  Sig: TAKE 1 TABLET (25 MG TOTAL) BY MOUTH DAILY.  ?  ? Endocrinology:  Diabetes - SGLT2 Inhibitors Passed - 08/04/2021  2:34 AM  ?  ?  Passed - Cr in normal range and within 360 days  ?  Creat  ?Date Value Ref Range Status  ?05/23/2021 0.83 0.60 - 1.29 mg/dL Final  ?   ?  ?  Passed - HBA1C is between 0 and 7.9 and within 180 days  ?  Hemoglobin A1C  ?Date Value Ref Range Status  ?12/13/2019 11.3  Final  ? ?Hgb A1c MFr Bld  ?Date Value Ref Range Status  ?05/23/2021 6.8 (H) <5.7 % of total Hgb Final  ?  Comment:  ?  For someone without known diabetes, a hemoglobin A1c ?value of 6.5% or greater indicates that they may have  ?diabetes and this should be confirmed with a follow-up  ?test. ?. ?For someone with known diabetes, a value <7% indicates  ?that their diabetes is well controlled and a value  ?greater than or equal to 7% indicates suboptimal  ?control. A1c targets should be individualized based on  ?duration of diabetes, age, comorbid conditions, and  ?other considerations. ?. ?Currently, no consensus exists regarding use of ?hemoglobin A1c for diagnosis of diabetes for children. ?. ?  ?   ?  ?  Passed - eGFR in normal range and within 360 days  ?  GFR calc non Af Amer  ?Date Value Ref Range Status  ?12/28/2017 104  Final  ? ?eGFR  ?Date Value Ref Range Status  ?05/23/2021 110 > OR = 60 mL/min/1.66m Final  ?  Comment:  ?  The eGFR is based on the CKD-EPI 2021 equation. To calculate  ?the new eGFR from a previous Creatinine or Cystatin C ?result, go to https://www.kidney.org/professionals/ ?kdoqi/gfr%5Fcalculator ?  ?   ?  ?  Passed - Valid encounter within last 6 months  ?  Recent Outpatient Visits   ?      ? 2 months ago Annual physical exam  ? SPrice DO  ? 5 months ago Type 2 diabetes mellitus with other specified  complication, without long-term current use of insulin ([place]  ? SCaney DO  ? 9 months ago Type 2 diabetes mellitus with other specified complication, without long-term current use of insulin ([place]  ? SItta Bena DO  ? 1 year ago Type 2 diabetes mellitus with other specified complication, without long-term current use of insulin ([place]  ? SFort Mitchell DO  ?  ?  ?Future Appointments   ?        ? In 3 months KParks Ranger ADevonne Doughty DO [facility] ?  ? ?  ?  ?  ? ? ?

## 2021-11-28 ENCOUNTER — Ambulatory Visit: Payer: Managed Care, Other (non HMO) | Admitting: Family Medicine

## 2021-12-12 ENCOUNTER — Ambulatory Visit: Payer: Managed Care, Other (non HMO) | Admitting: Family Medicine

## 2021-12-12 ENCOUNTER — Encounter: Payer: Self-pay | Admitting: Family Medicine

## 2021-12-12 VITALS — BP 139/68 | HR 88 | Ht 68.0 in | Wt 212.0 lb

## 2021-12-12 DIAGNOSIS — E1169 Type 2 diabetes mellitus with other specified complication: Secondary | ICD-10-CM | POA: Diagnosis not present

## 2021-12-12 LAB — POCT GLYCOSYLATED HEMOGLOBIN (HGB A1C): Hemoglobin A1C: 8 % — AB (ref 4.0–5.6)

## 2021-12-12 MED ORDER — RYBELSUS 3 MG PO TABS: 3.0000 mg | ORAL_TABLET | Freq: Every day | ORAL | 0 refills | Status: DC

## 2021-12-12 MED ORDER — JARDIANCE 25 MG PO TABS: 25.0000 mg | ORAL_TABLET | Freq: Every day | ORAL | 1 refills | Status: DC

## 2021-12-12 NOTE — Progress Notes (Unsigned)
Subjective:    Patient ID: Cory Horne, male    DOB: [birth year], 46 y.o.   MRN: [id number]  Cory Horne is a 46 y.o. male presenting on 12/12/2021 for Diabetes   HPI  CHRONIC DM, Type 2: Updates since 11/18, he changed diet to avoid all carbs He has done well previously with A1c 6.8% He was doing well OFF Trulicity, and he continued Jardiance but not always adhering to medicine He says wife has been in [place] for now, unsure when she returns. Now he is worsening diet lifestyle, admits difficulty with portion control and diet now he is managing his own meals He asks about CGM   , was having CBG 89 He monitors CBG day and night, avg 90-120 avg in AM, PM with 90.   Trulicity 0.$RemoveBefo'75mg'RHdtyoOyekN$  up to 1.$Remov'5mg'dpXuTb$  weekly, hard time to get it approved. He was 1 month without Trulicity. - He continued the Jardiance $RemoveBefor'25mg'MynYRAMfwqAL$  daily and Metformin XR. - He has come off Metformin. - Now back on  Prior A1c range >8-11, due for A1c today, last was 8.7 Admits blurry vision if off med, then back on Jardiance has improved vision   Meds: Jardiance $RemoveBeforeD'25mg'FhXEZaoJQtPUeC$  daily Off trulicity Off metformin Reports good compliance. Tolerating well w/o side-effects Currently not on ACEi / ARB Lifestyle: - Diet (improving diet) - Exercise (no regular exercise except regular walking at work, also very active at home)   Health Maintenance: ***     05/23/2021    8:38 AM 10/11/2020    1:46 PM 06/28/2020    3:45 PM  Depression screen PHQ 2/9  Decreased Interest 0 0 0  Down, Depressed, Hopeless 0 0 0  PHQ - 2 Score 0 0 0  Altered sleeping 0 0   Tired, decreased energy 0 0   Change in appetite 0 0   Feeling bad or failure about yourself  0 0   Trouble concentrating 0 0   Moving slowly or fidgety/restless 0 0   Suicidal thoughts 0 0   PHQ-9 Score 0 0   Difficult doing work/chores Not difficult at all Not difficult at all     Social History   Tobacco Use   Smoking status: Former    Packs/day: 2.00    Years: 26.00    Total  pack years: 52.00    Types: Cigarettes    Quit date: 2018    Years since quitting: 5.7   Smokeless tobacco: Never  Substance Use Topics   Alcohol use: Not Currently    Review of Systems Per HPI unless specifically indicated above     Objective:    BP 139/68   Pulse 88   Ht $R'5\' 8"'Aq$  (1.727 m)   Wt 212 lb (96.2 kg)   SpO2 97%   BMI 32.23 kg/m   Wt Readings from Last 3 Encounters:  12/12/21 212 lb (96.2 kg)  05/23/21 202 lb 6.4 oz (91.8 kg)  02/14/21 209 lb 6.4 oz (95 kg)    Physical Exam  Diabetic Foot Exam - Simple   Simple Foot Form Diabetic Foot exam was performed with the following findings: Yes 12/12/2021  3:50 PM  Visual Inspection No deformities, no ulcerations, no other skin breakdown bilaterally: Yes Sensation Testing Intact to touch and monofilament testing bilaterally: Yes Pulse Check Posterior Tibialis and Dorsalis pulse intact bilaterally: Yes Comments      Results for orders placed or performed in visit on 05/23/21  COMPLETE METABOLIC PANEL WITH GFR  Result Value Ref Range  Glucose, Bld 104 (H) 65 - 99 mg/dL   BUN 19 7 - 25 mg/dL   Creat 0.83 0.60 - 1.29 mg/dL   eGFR 110 > OR = 60 mL/min/1.40m2   BUN/Creatinine Ratio NOT APPLICABLE 6 - 22 (calc)   Sodium 137 135 - 146 mmol/L   Potassium 4.1 3.5 - 5.3 mmol/L   Chloride 101 98 - 110 mmol/L   CO2 25 20 - 32 mmol/L   Calcium 9.6 8.6 - 10.3 mg/dL   Total Protein 7.2 6.1 - 8.1 g/dL   Albumin 4.6 3.6 - 5.1 g/dL   Globulin 2.6 1.9 - 3.7 g/dL (calc)   AG Ratio 1.8 1.0 - 2.5 (calc)   Total Bilirubin 1.1 0.2 - 1.2 mg/dL   Alkaline phosphatase (APISO) 56 36 - 130 U/L   AST 21 10 - 40 U/L   ALT 32 9 - 46 U/L  Lipid panel  Result Value Ref Range   Cholesterol 167 <200 mg/dL   HDL 41 > OR = 40 mg/dL   Triglycerides 146 <150 mg/dL   LDL Cholesterol (Calc) 101 (H) mg/dL (calc)   Total CHOL/HDL Ratio 4.1 <5.0 (calc)   Non-HDL Cholesterol (Calc) 126 <130 mg/dL (calc)  CBC with Differential/Platelet   Result Value Ref Range   WBC 5.6 3.8 - 10.8 Thousand/uL   RBC 5.41 4.20 - 5.80 Million/uL   Hemoglobin 16.1 13.2 - 17.1 g/dL   HCT 47.3 38.5 - 50.0 %   MCV 87.4 80.0 - 100.0 fL   MCH 29.8 27.0 - 33.0 pg   MCHC 34.0 32.0 - 36.0 g/dL   RDW 13.1 11.0 - 15.0 %   Platelets 183 140 - 400 Thousand/uL   MPV 11.9 7.5 - 12.5 fL   Neutro Abs 3,108 1,500 - 7,800 cells/uL   Lymphs Abs 1,753 850 - 3,900 cells/uL   Absolute Monocytes 616 200 - 950 cells/uL   Eosinophils Absolute 101 15 - 500 cells/uL   Basophils Absolute 22 0 - 200 cells/uL   Neutrophils Relative % 55.5 %   Total Lymphocyte 31.3 %   Monocytes Relative 11.0 %   Eosinophils Relative 1.8 %   Basophils Relative 0.4 %  Hemoglobin A1c  Result Value Ref Range   Hgb A1c MFr Bld 6.8 (H) <5.7 % of total Hgb   Mean Plasma Glucose 148 mg/dL   eAG (mmol/L) 8.2 mmol/L  PSA  Result Value Ref Range   PSA 0.63 < OR = 4.00 ng/mL  Cologuard  Result Value Ref Range   COLOGUARD Negative Negative  Hepatitis C antibody  Result Value Ref Range   Hepatitis C Ab NON-REACTIVE NON-REACTIVE   SIGNAL TO CUT-OFF 0.02 <1.00      Assessment & Plan:   Problem List Items Addressed This Visit     Type 2 diabetes mellitus with other specified complication ([place]) - Primary   Relevant Orders   POCT HgB A1C    No orders of the defined types were placed in this encounter.     Follow up plan: No follow-ups on file.  Future labs ordered for ***  Nobie Putnam, DO [facility] 12/12/2021, 3:34 PM

## 2021-12-12 NOTE — Patient Instructions (Addendum)
Thank you for coming to the office today.  Try the [facility] 2 - download the app to monitor the sugar.  Restart Jardiance 25mg  daily, 90 day ordered  Start Rybelsus new medication, 30 day ordered.  Starting dose is 3mg  once daily, first thing in morning on empty stomach, sip of water, no other medicines. Wait 30 min before first meal of day.  After 30 days we need to increase dose up to 7 mg - call to request new rx.  If need can add copay coupon savings card online, https://www.rybelsus.com/savings-and-support.html  Please schedule a Follow-up Appointment to: Return in about 3 months (around 03/13/2022) for 3 month DM A1c.  If you have any other questions or concerns, please feel free to call the office or send a message through MyChart. You may also schedule an earlier appointment if necessary.  Additionally, you may be receiving a survey about your experience at our office within a few days to 1 week by e-mail or mail. We value your feedback.  [place], DO [facility], Saralyn Pilar

## 2021-12-13 LAB — MICROALBUMIN / CREATININE URINE RATIO
Creatinine, Urine: 144 mg/dL (ref 20–320)
Microalb Creat Ratio: 3 mcg/mg creat (ref <30)
Microalb, Ur: 0.4 mg/dL

## 2022-01-22 ENCOUNTER — Other Ambulatory Visit: Payer: Self-pay | Admitting: Family Medicine

## 2022-01-22 DIAGNOSIS — E1169 Type 2 diabetes mellitus with other specified complication: Secondary | ICD-10-CM

## 2022-01-23 MED ORDER — RYBELSUS 7 MG PO TABS: 7.0000 mg | ORAL_TABLET | Freq: Every day | ORAL | 1 refills | Status: DC

## 2022-01-23 NOTE — Telephone Encounter (Signed)
Requested medication (s) are due for refill today: yes  Requested medication (s) are on the active medication list: yes  Last refill:  12/12/21 #30  Future visit scheduled: yes in Jan 2024  Notes to clinic:  notifying for dose increase via pharmacy request   Requested Prescriptions  Pending Prescriptions Disp Refills   RYBELSUS 3 MG TABS [Pharmacy Med Name: RYBELSUS 3 MG TABLET] 30 tablet 0    Sig: Take 3 mg by mouth daily before breakfast. For 30 days, then notify office for dose increase to 7mg  new rx     Off-Protocol Failed - 01/22/2022  4:30 PM      Failed - Medication not assigned to a protocol, review manually.      Passed - Valid encounter within last 12 months    Recent Outpatient Visits           1 month ago Type 2 diabetes mellitus with other specified complication, without long-term current use of insulin ([place])   [facility] [place], Devonne Doughty, DO   8 months ago Annual physical exam   [facility] Olin Hauser, DO   11 months ago Type 2 diabetes mellitus with other specified complication, without long-term current use of insulin ([place])   [facility], Devonne Doughty, DO   1 year ago Type 2 diabetes mellitus with other specified complication, without long-term current use of insulin [facility])   [facility] Olin Hauser, DO   1 year ago Type 2 diabetes mellitus with other specified complication, without long-term current use of insulin ([place])   [facility], Devonne Doughty, DO       Future Appointments             In 2 months Parks Ranger, Devonne Doughty, DO [facility], [facility]

## 2022-04-10 ENCOUNTER — Ambulatory Visit: Payer: Managed Care, Other (non HMO) | Admitting: Family Medicine
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
## 2022-12-31 LAB — HM DIABETES EYE EXAM

## 2023-01-22 ENCOUNTER — Other Ambulatory Visit: Payer: Self-pay | Admitting: Family Medicine

## 2023-01-22 ENCOUNTER — Encounter: Payer: Self-pay | Admitting: Family Medicine

## 2023-01-22 ENCOUNTER — Ambulatory Visit (INDEPENDENT_AMBULATORY_CARE_PROVIDER_SITE_OTHER): Payer: Managed Care, Other (non HMO) | Admitting: Family Medicine

## 2023-01-22 DIAGNOSIS — Z125 Encounter for screening for malignant neoplasm of prostate: Secondary | ICD-10-CM

## 2023-01-22 DIAGNOSIS — E1169 Type 2 diabetes mellitus with other specified complication: Secondary | ICD-10-CM

## 2023-01-22 DIAGNOSIS — E66811 Obesity, class 1: Secondary | ICD-10-CM

## 2023-01-22 DIAGNOSIS — Z0000 Encounter for general adult medical examination without abnormal findings: Secondary | ICD-10-CM

## 2023-01-22 LAB — POCT GLYCOSYLATED HEMOGLOBIN (HGB A1C): Hemoglobin A1C: 12.2 % — AB (ref 4.0–5.6)

## 2023-01-22 NOTE — Progress Notes (Signed)
Subjective:    Patient ID: Cory Horne, male    DOB: 1976/02/06, 47 y.o.   MRN: [id number]  Cory Horne is a 47 y.o. male presenting on 01/22/2023 for Follow-up (Pt requesting med refill)   HPI  Discussed the use of AI scribe software for clinical note transcription with the patient, who gave verbal consent to proceed.     CHRONIC DM, Type 2:  The patient, with a history of type 2 diabetes, presents with poorly controlled blood glucose levels. He reports that his A1C was 8.0 at his last visit 1 yr ago, but today it has increased to 12.2. The patient attributes this increase to a disruption in his medication regimen due to travel to [place] to visit ill family member. He reports running out of his prescribed medication, Rybelsus, several months ago and has not been able to resume it since.  Prior med list - Trulicity, Metformin, Rybelsus, Jardiance Current meds - OFF all meds  However, he had difficulty obtaining this medication due to insurance issues. He expresses a preference for injectable medications, stating they seem to work better for him.  CGM: Dexcom Stelo OTC reading 230-250+ unable to read >250  He recently had an eye exam, which showed no signs of diabetic retinopathy. He is planning to travel out of the country again in December for two weeks and expresses a desire to have his medication regimen sorted out before then.      Denies hypoglycemia, polyuria, visual changes, numbness or tingling.     01/22/2023    7:09 PM 05/23/2021    8:38 AM 10/11/2020    1:46 PM  Depression screen PHQ 2/9  Decreased Interest 0 0 0  Down, Depressed, Hopeless 0 0 0  PHQ - 2 Score 0 0 0  Altered sleeping  0 0  Tired, decreased energy  0 0  Change in appetite  0 0  Feeling bad or failure about yourself   0 0  Trouble concentrating  0 0  Moving slowly or fidgety/restless  0 0  Suicidal thoughts  0 0  PHQ-9 Score  0 0  Difficult doing work/chores  Not difficult at all Not difficult  at all    Social History   Tobacco Use   Smoking status: Former    Current packs/day: 0.00    Average packs/day: 2.0 packs/day for 26.0 years (52.0 ttl pk-yrs)    Types: Cigarettes    Start date: 61    Quit date: 2018    Years since quitting: 6.8   Smokeless tobacco: Never  Substance Use Topics   Alcohol use: Yes    Comment: occ    Review of Systems Per HPI unless specifically indicated above     Objective:    BP 122/78 (BP Location: Left Arm, Patient Position: Sitting, Cuff Size: Normal)   Pulse (!) 101   Ht 5\' 8"  (1.727 m)   Wt 207 lb (93.9 kg)   SpO2 98%   BMI 31.47 kg/m   Wt Readings from Last 3 Encounters:  01/22/23 207 lb (93.9 kg)  12/12/21 212 lb (96.2 kg)  05/23/21 202 lb 6.4 oz (91.8 kg)    Physical Exam Vitals and nursing note reviewed.  Constitutional:      General: He is not in acute distress.    Appearance: Normal appearance. He is well-developed. He is obese. He is not diaphoretic.     Comments: Well-appearing, comfortable, cooperative  HENT:     Head: Normocephalic and atraumatic.  Eyes:     General:        Right eye: No discharge.        Left eye: No discharge.     Conjunctiva/sclera: Conjunctivae normal.  Cardiovascular:     Rate and Rhythm: Normal rate.  Pulmonary:     Effort: Pulmonary effort is normal.  Skin:    General: Skin is warm and dry.     Findings: No erythema or rash.  Neurological:     Mental Status: He is alert and oriented to person, place, and time.  Psychiatric:        Mood and Affect: Mood normal.        Behavior: Behavior normal.        Thought Content: Thought content normal.     Comments: Well groomed, good eye contact, normal speech and thoughts    Results for orders placed or performed in visit on 01/22/23  POCT glycosylated hemoglobin (Hb A1C)  Result Value Ref Range   Hemoglobin A1C 12.2 (A) 4.0 - 5.6 %   HbA1c POC (<> result, manual entry)     HbA1c, POC (prediabetic range)     HbA1c, POC (controlled  diabetic range)        Assessment & Plan:   Problem List Items Addressed This Visit     Type 2 diabetes mellitus with other specified complication (HCC)   Relevant Orders   POCT glycosylated hemoglobin (Hb A1C) (Completed)   AMB Referral VBCI Care Management    Assessment and Plan    Type 2 Diabetes Mellitus Poor control with an A1C of 12.2. Patient has been off medications for months and has a high appetite. Previously on Rybelsus and Trulicity, but had insurance issues with Trulicity. Discussed the benefits of injectable GLP-1 receptor agonists, specifically Ozempic and Mounjaro.  -Start Ozempic 0.25mg  weekly injection for the first 4 weeks, then increase to 0.5mg  weekly injection for the final 2 weeks (6-week sample provided).  -Consult with clinical pharmacist, to determine insurance coverage and potentially switch to [facility] 5mg  weekly injection after ozempic sample -Check A1C in 3 months.  General Health Maintenance -Plan for annual blood and urine panel in 3 months.       Orders Placed This Encounter  Procedures   AMB Referral VBCI Care Management    Referral Priority:   Routine    Referral Type:   Consultation    Referral Reason:   Care Coordination    Number of Visits Requested:   1   POCT glycosylated hemoglobin (Hb A1C)    No orders of the defined types were placed in this encounter.     Follow up plan: Return in about 3 months (around 04/24/2023) for 3 month fasting lab only then 1 week later Annual Physical.  Future labs ordered for 03/2023   Saralyn Pilar, DO [facility] 01/22/2023, 4:31 PM

## 2023-01-22 NOTE — Patient Instructions (Addendum)
Thank you for coming to the office today.  Estelle Grumbles - Clinical Pharmacist - will contact you within 1-2 weeks to work on the insurance coverage  Ozempic sample 0.25 mg weekly injection for the first 4 weeks Then up to 0.5mg  weekly inj for the final 2 weeks  May switch to [facility] 5 mg weekly  Recent Labs    01/22/23 1643  HGBA1C 12.2*   DUE for FASTING BLOOD WORK (no food or drink after midnight before the lab appointment, only water or coffee without cream/sugar on the morning of)  SCHEDULE "Lab Only" visit in the morning at the clinic for lab draw in  3 MONTHS   - Make sure Lab Only appointment is at about 1 week before your next appointment, so that results will be available  For Lab Results, once available within 2-3 days of blood draw, you can can log in to MyChart online to view your results and a brief explanation. Also, we can discuss results at next follow-up visit.   Please schedule a Follow-up Appointment to: Return in about 3 months (around 04/24/2023) for 3 month fasting lab only then 1 week later Annual Physical.  If you have any other questions or concerns, please feel free to call the office or send a message through MyChart. You may also schedule an earlier appointment if necessary.  Additionally, you may be receiving a survey about your experience at our office within a few days to 1 week by e-mail or mail. We value your feedback.  Saralyn Pilar, DO [facility] [place]

## 2023-01-28 ENCOUNTER — Telehealth: Payer: Self-pay

## 2023-01-28 NOTE — Progress Notes (Signed)
   Care Guide Note  01/28/2023 Name: Treveion Recio MRN: [id number] DOB: [birth year]  Referred by: Smitty Cords, DO Reason for referral : Care Coordination (Outreach to schedule with Pharm d )   Dione Donais is a 47 y.o. year old male who is a primary care patient of Smitty Cords, DO. Jullian Kazlauskas was referred to the pharmacist for assistance related to DM.    An unsuccessful telephone outreach was attempted today to contact the patient who was referred to the pharmacy team for assistance with medication assistance. Additional attempts will be made to contact the patient.   Penne Lash, RMA Care Guide [facility]  [place], [place] [address] Direct Dial: [phone] [email or web address]

## 2023-01-29 ENCOUNTER — Other Ambulatory Visit: Payer: Managed Care, Other (non HMO) | Admitting: Pharmacist

## 2023-01-29 ENCOUNTER — Encounter: Payer: Self-pay | Admitting: Pharmacist

## 2023-01-29 DIAGNOSIS — E1169 Type 2 diabetes mellitus with other specified complication: Secondary | ICD-10-CM

## 2023-01-29 MED ORDER — ROSUVASTATIN CALCIUM 5 MG PO TABS: 5.0000 mg | ORAL_TABLET | Freq: Every day | ORAL | 0 refills | Status: DC

## 2023-01-29 MED ORDER — MOUNJARO 2.5 MG/0.5ML ~~LOC~~ SOAJ: 2.5000 mg | SUBCUTANEOUS | 0 refills | Status: DC

## 2023-01-29 NOTE — Progress Notes (Signed)
01/29/2023 Name: Cory Horne MRN: [id number] DOB: 19-Oct-1975  Chief Complaint  Patient presents with   Medication Assistance   Medication Management    Cory Horne is a 47 y.o. year old male who presented for a telephone visit.   They were referred to the pharmacist by their PCP for assistance in managing diabetes and medication access.    Subjective:  Care Team: Primary Care Provider: Smitty Cords, DO ; Next Scheduled Visit: 04/23/2023  Medication Access/Adherence  [phone] [place] Rough, [place] - [address] [place] [address] Phone: [phone] Fax: [phone]   Patient reports affordability concerns with their medications: Yes  Patient reports access/transportation concerns to their pharmacy: No  Patient reports adherence concerns with their medications:  No     Diabetes:  Current medications:  - Ozempic 0.25 mg weekly on Saturdays (started on 10/26 from sample provided by PCP)  Reports had errors with injection technique and wasted 2 doses (of 0.25 mg) with starting use  Medications tried in the past: Trulicity, Metformin, Jardiance, Rybelsus  Reports has glucometer and testing supplies at home, but denies checking blood sugar recently  Current physical activity: reports walks on and off throughout the day (~9,000-10,000 steps/day)  Reports interested in switching to [facility], as discussed with PCP, if able to receive this through his insurance plan  Statin therapy: none  Objective:  Lab Results  Component Value Date   HGBA1C 12.2 (A) 01/22/2023    Lab Results  Component Value Date   CREATININE 0.83 05/23/2021   BUN 19 05/23/2021   NA 137 05/23/2021   K 4.1 05/23/2021   CL 101 05/23/2021   CO2 25 05/23/2021    Lab Results  Component Value Date   CHOL 167 05/23/2021   HDL 41 05/23/2021   LDLCALC 101 (H) 05/23/2021   TRIG 146 05/23/2021   CHOLHDL 4.1 05/23/2021    Medications  Reviewed Today     Reviewed by Manuela Neptune, RPH-CPP (Pharmacist) on 01/29/23 at 1123  Med List Status: <None>   Medication Order Taking? Sig Documenting Provider Last Dose Status Informant  tirzepatide [facility]) 2.5 MG/0.5ML Pen [id number] Yes Inject 2.5 mg into the skin once a week for 4 doses. Cory Cords, DO  Active               Assessment/Plan:   Diabetes: - Currently uncontrolled - Reviewed long term cardiovascular and renal outcomes of uncontrolled blood sugar - Reviewed goal A1c, goal fasting, and goal 2 hour post prandial glucose - Reviewed dietary modifications including importance of having regular well-balanced meals and snacks throughout the day, while controlling carbohydrate portion sizes - Review health plan benefits for patient. Greggory Keen is covered through his [place] health plan. CPP sends Rx for Mounjaro 2.5 mg injection weekly for 4 weeks to pharmacy for patient.  [facility] Patient's copayment is $25/month  Follow up with patient. He would like to switch to [facility] - Agree patient to STOP taking Ozempic. Start Mounjaro 2.5 mg injection weekly on Saturday.  -Counseled on GLP1 agonists, including mechanism of action, side effects, and benefits. No personal or family history of medullary thyroid cancer, personal history of pancreatitis or gallbladder disease. Counseled on potential side effects of nausea, stomach upset, queasiness, constipation, and that these generally improve over time. Advised to contact our office with more severe symptoms, including nausea, diarrhea, stomach pain. Patient verbalized understanding. - Counsel on administration technique for  Mounjaro and sent patient link to "how to use" video from manufacturer to review prior to starting - Collaborate with PCP - Recommend to restart checking glucose, keep log of results and have this record to review at upcoming medical appointments. Patient to contact provider  office sooner if needed for readings outside of established parameters or symptoms  - Discuss use of statin medication for LDL control/ASCVD risk reduction, particularly in patients with Type 2 Diabetes. Patient agreeable to start statin therapy  CPP sends prescription for rosuvastatin 5 mg daily to pharmacy for patient  Follow Up Plan: Clinical Pharmacist will follow up with patient by telephone on 02/19/2023 at 10:00 AM   Estelle Grumbles, PharmD, [place]y [place], CPP Clinical Pharmacist [facility] [phone]

## 2023-01-29 NOTE — Patient Instructions (Signed)
Goals Addressed             This Visit's Progress    Pharmacy Goals       Please STOP taking Ozempic. Start Mounjaro 2.5 mg injection weekly on Saturday. This medication may cause stomach upset, queasiness, or constipation, especially when first starting. This generally improves over time. Call our office if these symptoms occur and worsen, or if you have severe symptoms such as vomiting, diarrhea, or stomach pain.    The following is the web address for the "how to use" video for [facility]. Please copy and paste this address into your web browser and review the video prior to starting Mounjaro.   [email or web address]-to-[email or web address]  The goal A1c is less than 7%. This is the best way to reduce the risk of the long term complications of diabetes, including heart disease, kidney disease, eye disease, strokes, and nerve damage. An A1c of less than 7% corresponds with fasting sugars less than 130 and 2 hour after meal sugars less than 180.   Our goal bad cholesterol, or LDL, is less than 70 . This is why it is important to take your rosuvastatin.   If you have any questions, please reach out to me or the office.   Thank you!   Estelle Grumbles, PharmD, [place]y [place], CPP Clinical Pharmacist [facility] [phone]

## 2023-02-19 ENCOUNTER — Other Ambulatory Visit: Payer: Managed Care, Other (non HMO) | Admitting: Pharmacist

## 2023-02-19 DIAGNOSIS — E1169 Type 2 diabetes mellitus with other specified complication: Secondary | ICD-10-CM

## 2023-02-19 MED ORDER — MOUNJARO 5 MG/0.5ML ~~LOC~~ SOAJ: 5.0000 mg | SUBCUTANEOUS | 0 refills | Status: DC

## 2023-02-19 NOTE — Progress Notes (Unsigned)
   02/19/2023 Name: Cory Horne MRN: [id number] DOB: October 06, 1975  Chief Complaint  Patient presents with   Medication Management    Cory Horne is a 47 y.o. year old male who presented for a telephone visit.   They were referred to the pharmacist by their PCP for assistance in managing diabetes and medication access.      Subjective:   Care Team: Primary Care Provider: Smitty Cords, DO ; Next Scheduled Visit: 04/23/2023  Medication Access/Adherence  [phone], [place] - [address] [place] [address] Phone: [phone] Fax: [phone]   Patient reports affordability concerns with their medications: No  Patient reports access/transportation concerns to their pharmacy: No  Patient reports adherence concerns with their medications:  No       Diabetes:   Current medications:  - Mounjaro 2.5 mg weekly on Saturdays (started on 11/9)  Reports tolerating well   Medications tried in the past: Trulicity, Metformin, Jardiance, Rybelsus   Reports home morning blood sugar readings ranging: 238-303  - Note blood sugar control has improved since starting Mounjaro   Current physical activity: reports walks on and off throughout the day (~9,000-10,000 steps/day)   Statin therapy: rosuvastatin 5 mg daily   Objective:  Lab Results  Component Value Date   HGBA1C 12.2 (A) 01/22/2023    Lab Results  Component Value Date   CREATININE 0.83 05/23/2021   BUN 19 05/23/2021   NA 137 05/23/2021   K 4.1 05/23/2021   CL 101 05/23/2021   CO2 25 05/23/2021    Lab Results  Component Value Date   CHOL 167 05/23/2021   HDL 41 05/23/2021   LDLCALC 101 (H) 05/23/2021   TRIG 146 05/23/2021   CHOLHDL 4.1 05/23/2021    Medications Reviewed Today     Reviewed by Manuela Neptune, RPH-CPP (Pharmacist) on 02/19/23 at 1015  Med List Status: <None>   Medication Order Taking? Sig Documenting Provider Last Dose  Status Informant  rosuvastatin (CRESTOR) 5 MG tablet [id number] Yes Take 1 tablet (5 mg total) by mouth daily. Smitty Cords, DO Taking Active   tirzepatide [facility]) 2.5 MG/0.5ML Pen [id number] Yes Inject 2.5 mg into the skin once a week for 4 doses. Smitty Cords, DO Taking Active               Assessment/Plan:   Diabetes: - Currently uncontrolled - Reviewed long term cardiovascular and renal outcomes of uncontrolled blood sugar - Reviewed dietary modifications including importance of having regular well-balanced meals and snacks throughout the day, while controlling carbohydrate portion sizes - Recommend plan to increase Mounjaro dose to 5 mg injection weekly on Saturdays, after has completed 4 doses of the Mounjaro 2.5 mg weekly   Will collaborate with PCP  - Recommend to check glucose, keep log of results and have this record to review at upcoming medical appointments. Patient to contact provider office sooner if needed for readings outside of established parameters or symptoms     Follow Up Plan: Clinical Pharmacist will follow up with patient by telephone on 03/19/2023 at 10:45 AM    Estelle Grumbles, PharmD, [place]y [place], CPP Clinical Pharmacist [facility] [phone]

## 2023-02-19 NOTE — Progress Notes (Signed)
Mounjaro 5 mg sent to pharmacy

## 2023-02-19 NOTE — Patient Instructions (Signed)
Goals Addressed             This Visit's Progress    Pharmacy Goals       The goal A1c is less than 7%. This is the best way to reduce the risk of the long term complications of diabetes, including heart disease, kidney disease, eye disease, strokes, and nerve damage. An A1c of less than 7% corresponds with fasting sugars less than 130 and 2 hour after meal sugars less than 180.   Our goal bad cholesterol, or LDL, is less than 70 . This is why it is important to take your rosuvastatin.   If you have any questions, please reach out to me or the office.   Thank you!   Estelle Grumbles, PharmD, [place], CPP Clinical Pharmacist [facility] [phone]

## 2023-02-23 ENCOUNTER — Other Ambulatory Visit: Payer: Self-pay | Admitting: Family Medicine

## 2023-02-23 DIAGNOSIS — E1169 Type 2 diabetes mellitus with other specified complication: Secondary | ICD-10-CM

## 2023-02-23 MED ORDER — MOUNJARO 5 MG/0.5ML ~~LOC~~ SOAJ: 5.0000 mg | SUBCUTANEOUS | 2 refills | Status: DC

## 2023-02-23 NOTE — Telephone Encounter (Signed)
Requested medication (s) are due for refill today - no  Requested medication (s) are on the active medication list -yes-not at this dose  Future visit scheduled -no  Last refill: na  Notes to clinic: off protocol- no longer current at this dose  Requested Prescriptions  Pending Prescriptions Disp Refills   MOUNJARO 2.5 MG/0.5ML Pen [Pharmacy Med Name: MOUNJARO 2.5 MG/0.5 ML PEN]      Sig: Inject 2.5 mg into the skin once a week for 4 doses.     Off-Protocol Failed - 02/23/2023  2:26 AM      Failed - Medication not assigned to a protocol, review manually.      Passed - Valid encounter within last 12 months    Recent Outpatient Visits           1 month ago Type 2 diabetes mellitus with other specified complication, without long-term current use of insulin [facility])   [place] [facility] [place], Netta Neat, DO   1 year ago Type 2 diabetes mellitus with other specified complication, without long-term current use of insulin [facility])   [place] [facility] [place] Charon, Netta Neat, DO   1 year ago Annual physical exam   [place] [facility] Smitty Cords, DO   2 years ago Type 2 diabetes mellitus with other specified complication, without long-term current use of insulin [facility])   [place] [facility] [place], Netta Neat, DO   2 years ago Type 2 diabetes mellitus with other specified complication, without long-term current use of insulin [facility])   [facility] Smitty Cords, DO                 Requested Prescriptions  Pending Prescriptions Disp Refills   MOUNJARO 2.5 MG/0.5ML Pen [Pharmacy Med Name: MOUNJARO 2.5 MG/0.5 ML PEN]      Sig: Inject 2.5 mg into the skin once a week for 4 doses.     Off-Protocol Failed - 02/23/2023  2:26 AM      Failed - Medication not assigned to a protocol, review manually.      Passed - Valid encounter within last 12  months    Recent Outpatient Visits           1 month ago Type 2 diabetes mellitus with other specified complication, without long-term current use of insulin [facility])   [place] [facility] [place], Netta Neat, DO   1 year ago Type 2 diabetes mellitus with other specified complication, without long-term current use of insulin [facility])   [place] [facility] [place] Charon, Netta Neat, DO   1 year ago Annual physical exam   [place] [facility] Smitty Cords, DO   2 years ago Type 2 diabetes mellitus with other specified complication, without long-term current use of insulin [facility])   [facility] [place], Netta Neat, DO   2 years ago Type 2 diabetes mellitus with other specified complication, without long-term current use of insulin [facility])    [facility], Netta Neat, [place]

## 2023-03-19 ENCOUNTER — Other Ambulatory Visit: Payer: Managed Care, Other (non HMO)

## 2023-03-19 ENCOUNTER — Telehealth: Payer: Self-pay | Admitting: Pharmacist

## 2023-03-19 NOTE — Telephone Encounter (Addendum)
   Outreach Note  03/19/2023 Name: Cory Horne MRN: [id number] DOB: 1975/11/25  Referred by: Smitty Cords, DO   Was unable to reach patient via telephone today and unable to leave message as patient's voicemail box is full   Follow Up Plan: Will attempt to reach patient by telephone again within the next 30 days  Estelle Grumbles, PharmD, [place]y [place], CPP Clinical Pharmacist [facility] [phone]

## 2023-03-26 ENCOUNTER — Telehealth: Payer: Self-pay | Admitting: Pharmacist

## 2023-03-26 ENCOUNTER — Other Ambulatory Visit: Payer: Managed Care, Other (non HMO)

## 2023-03-26 NOTE — Telephone Encounter (Signed)
   Outreach Note  03/26/2023 Name: Cory Horne MRN: [id number] DOB: 08/23/1975  Referred by: Smitty Cords, DO   Was unable to reach patient via telephone today and unable to leave message as patient's voicemail box is full. Outreach attempt #2   Follow Up Plan: Will collaborate with Care Guide for assistance with reaching out to patient.  Estelle Grumbles, PharmD, [place]y [place], CPP Clinical Pharmacist [facility] [phone]

## 2023-04-07 ENCOUNTER — Telehealth: Payer: Self-pay

## 2023-04-07 NOTE — Telephone Encounter (Signed)
 Koren S[place]ersheim (Key: [place]) Rx #: [id number] Need Help? Call us at [phone] Status New (Not sent to plan) Drug Mounjaro 5MG /0.5ML auto-injectors ePA cloud Pension scheme manager [place] Form [phone] NCPDP) Original Claim Info 75

## 2023-04-12 ENCOUNTER — Other Ambulatory Visit: Payer: Self-pay

## 2023-04-12 DIAGNOSIS — E1169 Type 2 diabetes mellitus with other specified complication: Secondary | ICD-10-CM

## 2023-04-12 MED ORDER — MOUNJARO 5 MG/0.5ML ~~LOC~~ SOAJ: 5.0000 mg | SUBCUTANEOUS | 2 refills | Status: DC

## 2023-04-12 NOTE — Telephone Encounter (Signed)
 Cory Horne (Key: [place]) PA Case ID #: [id number] Rx #: [id number] Need Help? Call us  at [phone] Outcome Approved on January 11 by [facility] 2017 RjdzPi:[id number];Dujuld:[place];Review Type:Prior Auth;Coverage Start Date:04/07/2023;Coverage End Date:04/09/2024; Authorization Expiration Date: 04/08/2024 Drug Mounjaro  5MG /0.5ML auto-injectors ePA cloud logo Form [employer] Commercial Electronic PA Form [phone] NCPDP) Original Claim Info 75

## 2023-04-16 ENCOUNTER — Other Ambulatory Visit: Payer: Managed Care, Other (non HMO)

## 2023-04-16 DIAGNOSIS — Z125 Encounter for screening for malignant neoplasm of prostate: Secondary | ICD-10-CM

## 2023-04-16 DIAGNOSIS — Z0000 Encounter for general adult medical examination without abnormal findings: Secondary | ICD-10-CM

## 2023-04-16 DIAGNOSIS — E66811 Obesity, class 1: Secondary | ICD-10-CM

## 2023-04-16 DIAGNOSIS — E1169 Type 2 diabetes mellitus with other specified complication: Secondary | ICD-10-CM

## 2023-04-17 LAB — COMPLETE METABOLIC PANEL WITH GFR
AG Ratio: 1.8 (calc) (ref 1.0–2.5)
ALT: 33 U/L (ref 9–46)
AST: 17 U/L (ref 10–40)
Albumin: 4.4 g/dL (ref 3.6–5.1)
Alkaline phosphatase (APISO): 111 U/L (ref 36–130)
BUN: 14 mg/dL (ref 7–25)
CO2: 28 mmol/L (ref 20–32)
Calcium: 9.5 mg/dL (ref 8.6–10.3)
Chloride: 96 mmol/L — ABNORMAL LOW (ref 98–110)
Creat: 0.81 mg/dL (ref 0.60–1.29)
Globulin: 2.5 g/dL (ref 1.9–3.7)
Glucose, Bld: 353 mg/dL — ABNORMAL HIGH (ref 65–139)
Potassium: 4.1 mmol/L (ref 3.5–5.3)
Sodium: 133 mmol/L — ABNORMAL LOW (ref 135–146)
Total Bilirubin: 1.1 mg/dL (ref 0.2–1.2)
Total Protein: 6.9 g/dL (ref 6.1–8.1)
eGFR: 109 mL/min/{1.73_m2} (ref >=60)

## 2023-04-17 LAB — HEMOGLOBIN A1C
Hgb A1c MFr Bld: 12.3 %{Hb} — ABNORMAL HIGH (ref <5.7)
Mean Plasma Glucose: 306 mg/dL
eAG (mmol/L): 17 mmol/L

## 2023-04-17 LAB — LIPID PANEL
Cholesterol: 143 mg/dL (ref <200)
HDL: 41 mg/dL (ref >=40)
LDL Cholesterol (Calc): 73 mg/dL
Non-HDL Cholesterol (Calc): 102 mg/dL (ref <130)
Total CHOL/HDL Ratio: 3.5 (calc) (ref <5.0)
Triglycerides: 203 mg/dL — ABNORMAL HIGH (ref <150)

## 2023-04-17 LAB — PSA: PSA: 0.89 ng/mL (ref <=4.00)

## 2023-04-17 LAB — CBC WITH DIFFERENTIAL/PLATELET
Absolute Lymphocytes: 2785 {cells}/uL (ref 850–3900)
Absolute Monocytes: 378 {cells}/uL (ref 200–950)
Basophils Absolute: 32 {cells}/uL (ref 0–200)
Basophils Relative: 0.5 %
Eosinophils Absolute: 132 {cells}/uL (ref 15–500)
Eosinophils Relative: 2.1 %
HCT: 46.6 % (ref 38.5–50.0)
Hemoglobin: 15.5 g/dL (ref 13.2–17.1)
MCH: 29.1 pg (ref 27.0–33.0)
MCHC: 33.3 g/dL (ref 32.0–36.0)
MCV: 87.6 fL (ref 80.0–100.0)
MPV: 11.6 fL (ref 7.5–12.5)
Monocytes Relative: 6 %
Neutro Abs: 2974 {cells}/uL (ref 1500–7800)
Neutrophils Relative %: 47.2 %
Platelets: 235 10*3/uL (ref 140–400)
RBC: 5.32 10*6/uL (ref 4.20–5.80)
RDW: 13 % (ref 11.0–15.0)
Total Lymphocyte: 44.2 %
WBC: 6.3 10*3/uL (ref 3.8–10.8)

## 2023-04-17 LAB — MICROALBUMIN / CREATININE URINE RATIO
Creatinine, Urine: 30 mg/dL (ref 20–320)
Microalb Creat Ratio: 7 mg/g{creat} (ref <30)
Microalb, Ur: 0.2 mg/dL

## 2023-04-17 LAB — TSH: TSH: 3.12 m[IU]/L (ref 0.40–4.50)

## 2023-04-21 ENCOUNTER — Encounter: Payer: Self-pay | Admitting: Family Medicine

## 2023-04-23 ENCOUNTER — Encounter: Payer: Self-pay | Admitting: Family Medicine

## 2023-04-23 ENCOUNTER — Ambulatory Visit: Payer: Managed Care, Other (non HMO) | Admitting: Family Medicine

## 2023-04-23 VITALS — BP 110/76 | HR 88 | Ht 68.0 in | Wt 206.0 lb

## 2023-04-23 DIAGNOSIS — E1169 Type 2 diabetes mellitus with other specified complication: Secondary | ICD-10-CM | POA: Diagnosis not present

## 2023-04-23 DIAGNOSIS — E66811 Obesity, class 1: Secondary | ICD-10-CM | POA: Diagnosis not present

## 2023-04-23 DIAGNOSIS — E785 Hyperlipidemia, unspecified: Secondary | ICD-10-CM | POA: Diagnosis not present

## 2023-04-23 MED ORDER — ROSUVASTATIN CALCIUM 5 MG PO TABS: 5.0000 mg | ORAL_TABLET | Freq: Every day | ORAL | 3 refills | Status: DC

## 2023-04-23 NOTE — Patient Instructions (Addendum)
Thank you for coming to the office today.  Keep on Mounjaro 5mg  weekly 2 more refills to last until next apt. If any issues let me know  Recent Labs    01/22/23 1643 04/16/23 1333  HGBA1C 12.2* 12.3*    Refilled Rosuvastatin   You have been referred for a Coronary Calcium Score Cardiac CT Scan. This is a screening test for patients aged 48-50+ with cardiovascular risk factors or who are healthy but would be interested in Cardiovascular Screening for heart disease. Even if there is a family history of heart disease, this imaging can be useful. Typically it can be done every 5+ years or at a different timeline we agree on  The scan will look at the chest and mainly focus on the heart and identify early signs of calcium build up or blockages within the heart arteries. It is not 100% accurate for identifying blockages or heart disease, but it is useful to help [place] predict who may have some early changes or be at risk in the future for a heart attack or cardiovascular problem.  The results are reviewed by a Cardiologist and they will document the results. It should become available on MyChart. Typically the results are divided into percentiles based on other patients of the same demographic and age. So it will compare your risk to others similar to you. If you have a higher score >99 or higher percentile >75%tile, it is recommended to consider Statin cholesterol therapy and or referral to Cardiologist. I will try to help explain your results and if we have questions we can contact the Cardiologist.  You will be contacted for scheduling. Usually it is done at any imaging facility through [facility], [facility] or [facility].  The cost is $99 flat fee total and it does not go through insurance, so no authorization is required.   Please schedule a Follow-up Appointment to: Return in about 12 weeks (around 07/16/2023).  If you have any other questions or concerns,  please feel free to call the office or send a message through MyChart. You may also schedule an earlier appointment if necessary.  Additionally, you may be receiving a survey about your experience at our office within a few days to 1 week by e-mail or mail. We value your feedback.  Saralyn Pilar, DO [facility], [place]

## 2023-04-23 NOTE — Progress Notes (Signed)
Subjective:    Patient ID: Cory Horne, male    DOB: [birth year], 48 y.o.   MRN: [id number]  Cory Horne is a 48 y.o. male presenting on 04/23/2023 for Diabetes   HPI  Discussed the use of AI scribe software for clinical note transcription with the patient, who gave verbal consent to proceed.  History of Present Illness     CHRONIC DM, Type 2: Concerns with adherence to medication while recent trip out of country to [place] to urgent visit for family member. Also issue obtaining medication previously. Prior A1c >12 Now recent lab A1c 12.3 CBGs: Avg 200-300+, not checking regularly. Has improved since restarted med CGM: Dexcom Stelo OTC he will plan to use once CBG < 200s Meds: Mounjaro 5mg  weekly inj Prior med list - Trulicity, Metformin, Rybelsus, Jardiance Now adherence on medication Tolerating well w/o side-effects Lifestyle: - Diet (goal to improve diet now back in [place])  - Exercise (limited) Denies hypoglycemia, polyuria, visual changes, numbness or tingling.  HYPERLIPIDEMIA: - Reports no concerns. Last lipid panel controlled LDL 73 and high TG 203 - Currently taking Rosuvastatin 5mg  daily, tolerating well without side effects or myalgias      01/22/2023    7:09 PM 05/23/2021    8:38 AM 10/11/2020    1:46 PM  Depression screen PHQ 2/9  Decreased Interest 0 0 0  Down, Depressed, Hopeless 0 0 0  PHQ - 2 Score 0 0 0  Altered sleeping  0 0  Tired, decreased energy  0 0  Change in appetite  0 0  Feeling bad or failure about yourself   0 0  Trouble concentrating  0 0  Moving slowly or fidgety/restless  0 0  Suicidal thoughts  0 0  PHQ-9 Score  0 0  Difficult doing work/chores  Not difficult at all Not difficult at all       05/23/2021    8:38 AM 10/11/2020    1:46 PM  GAD 7 : Generalized Anxiety Score  Nervous, Anxious, on Edge 0 0  Control/stop worrying 0 0  Worry too much - different things 0 0  Trouble relaxing 0 0  Restless 0 0  Easily annoyed or  irritable 0 0  Afraid - awful might happen 0 0  Total GAD 7 Score 0 0  Anxiety Difficulty Not difficult at all Not difficult at all    Social History   Tobacco Use   Smoking status: Former    Current packs/day: 0.00    Average packs/day: 2.0 packs/day for 26.0 years (52.0 ttl pk-yrs)    Types: Cigarettes    Start date: 27    Quit date: 2018    Years since quitting: 7.0   Smokeless tobacco: Never  Substance Use Topics   Alcohol use: Yes    Comment: occ    Review of Systems Per HPI unless specifically indicated above     Objective:    BP 110/76   Pulse 88   Ht 5\' 8"  (1.727 m)   Wt 206 lb (93.4 kg)   SpO2 98%   BMI 31.32 kg/m   Wt Readings from Last 3 Encounters:  04/23/23 206 lb (93.4 kg)  01/22/23 207 lb (93.9 kg)  12/12/21 212 lb (96.2 kg)    Physical Exam Vitals and nursing note reviewed.  Constitutional:      General: He is not in acute distress.    Appearance: He is well-developed. He is obese. He is not diaphoretic.  Comments: Well-appearing, comfortable, cooperative  HENT:     Head: Normocephalic and atraumatic.  Eyes:     General:        Right eye: No discharge.        Left eye: No discharge.     Conjunctiva/sclera: Conjunctivae normal.  Neck:     Thyroid: No thyromegaly.  Cardiovascular:     Rate and Rhythm: Normal rate and regular rhythm.     Pulses: Normal pulses.     Heart sounds: Normal heart sounds. No murmur heard. Pulmonary:     Effort: Pulmonary effort is normal. No respiratory distress.     Breath sounds: Normal breath sounds. No wheezing or rales.  Musculoskeletal:        General: Normal range of motion.     Cervical back: Normal range of motion and neck supple.  Lymphadenopathy:     Cervical: No cervical adenopathy.  Skin:    General: Skin is warm and dry.     Findings: No erythema or rash.  Neurological:     Mental Status: He is alert and oriented to person, place, and time. Mental status is at baseline.  Psychiatric:         Behavior: Behavior normal.     Comments: Well groomed, good eye contact, normal speech and thoughts     Diabetic Foot Exam - Simple   Simple Foot Form Diabetic Foot exam was performed with the following findings: Yes 04/23/2023  2:34 PM  Visual Inspection No deformities, no ulcerations, no other skin breakdown bilaterally: Yes Sensation Testing Intact to touch and monofilament testing bilaterally: Yes Pulse Check Posterior Tibialis and Dorsalis pulse intact bilaterally: Yes Comments       Results for orders placed or performed in visit on 04/16/23  TSH   Collection Time: 04/16/23  1:33 PM  Result Value Ref Range   TSH 3.12 0.40 - 4.50 mIU/L  Microalbumin / creatinine urine ratio   Collection Time: 04/16/23  1:33 PM  Result Value Ref Range   Creatinine, Urine 30 20 - 320 mg/dL   Microalb, Ur 0.2 mg/dL   Microalb Creat Ratio 7 <30 mg/g creat  PSA   Collection Time: 04/16/23  1:33 PM  Result Value Ref Range   PSA 0.89 < OR = 4.00 ng/mL  CBC with Differential/Platelet   Collection Time: 04/16/23  1:33 PM  Result Value Ref Range   WBC 6.3 3.8 - 10.8 Thousand/uL   RBC 5.32 4.20 - 5.80 Million/uL   Hemoglobin 15.5 13.2 - 17.1 g/dL   HCT 16.1 09.6 - 04.5 %   MCV 87.6 80.0 - 100.0 fL   MCH 29.1 27.0 - 33.0 pg   MCHC 33.3 32.0 - 36.0 g/dL   RDW 40.9 81.1 - 91.4 %   Platelets 235 140 - 400 Thousand/uL   MPV 11.6 7.5 - 12.5 fL   Neutro Abs 2,974 1,500 - 7,800 cells/uL   Absolute Lymphocytes 2,785 850 - 3,900 cells/uL   Absolute Monocytes 378 200 - 950 cells/uL   Eosinophils Absolute 132 15 - 500 cells/uL   Basophils Absolute 32 0 - 200 cells/uL   Neutrophils Relative % 47.2 %   Total Lymphocyte 44.2 %   Monocytes Relative 6.0 %   Eosinophils Relative 2.1 %   Basophils Relative 0.5 %  COMPLETE METABOLIC PANEL WITH GFR   Collection Time: 04/16/23  1:33 PM  Result Value Ref Range   Glucose, Bld 353 (H) 65 - 139 mg/dL   BUN 14 7 -  25 mg/dL   Creat 9.60 4.54 - 0.98  mg/dL   eGFR 119 > OR = 60 JY/NWG/9.56O1   BUN/Creatinine Ratio SEE NOTE: 6 - 22 (calc)   Sodium 133 (L) 135 - 146 mmol/L   Potassium 4.1 3.5 - 5.3 mmol/L   Chloride 96 (L) 98 - 110 mmol/L   CO2 28 20 - 32 mmol/L   Calcium 9.5 8.6 - 10.3 mg/dL   Total Protein 6.9 6.1 - 8.1 g/dL   Albumin 4.4 3.6 - 5.1 g/dL   Globulin 2.5 1.9 - 3.7 g/dL (calc)   AG Ratio 1.8 1.0 - 2.5 (calc)   Total Bilirubin 1.1 0.2 - 1.2 mg/dL   Alkaline phosphatase (APISO) 111 36 - 130 U/L   AST 17 10 - 40 U/L   ALT 33 9 - 46 U/L  Lipid panel   Collection Time: 04/16/23  1:33 PM  Result Value Ref Range   Cholesterol 143 <200 mg/dL   HDL 41 > OR = 40 mg/dL   Triglycerides 308 (H) <150 mg/dL   LDL Cholesterol (Calc) 73 mg/dL (calc)   Total CHOL/HDL Ratio 3.5 <5.0 (calc)   Non-HDL Cholesterol (Calc) 102 <130 mg/dL (calc)  Hemoglobin M5H   Collection Time: 04/16/23  1:33 PM  Result Value Ref Range   Hgb A1c MFr Bld 12.3 (H) <5.7 % of total Hgb   Mean Plasma Glucose 306 mg/dL   eAG (mmol/L) 84.6 mmol/L      Assessment & Plan:   Problem List Items Addressed This Visit     Hyperlipidemia associated with type 2 diabetes mellitus (HCC)   Relevant Medications   rosuvastatin (CRESTOR) 5 MG tablet   Other Relevant Orders   CT CARDIAC SCORING (SELF PAY ONLY)   Obesity (BMI 30.0-34.9)   Type 2 diabetes mellitus with other specified complication (HCC) - Primary   Relevant Medications   rosuvastatin (CRESTOR) 5 MG tablet   Other Relevant Orders   CT CARDIAC SCORING (SELF PAY ONLY)      Type 2 Diabetes Mellitus Obesity BMI >31 Elevated blood glucose levels, patient recently had a period of lack of Mounjaro medicine due to issues with obtaining rx / recent travel out of country.  Now back on medication since 04/12/23, on for roughly 2 weeks  Discussed the importance of consistent medication use and the gradual effect on blood glucose levels over time. -Continue Mounjaro 5mg  weekly, with two more refills to  last until next appointment. -Check blood glucose levels regularly, aim for readings under 150. -Consider increasing Mounjaro dose at next appointment depending on blood glucose levels.  Continue working with clinical pharmacy as indicated. Will update the team.  Hyperlipidemia Currently managed with Rosuvastatin. -Refill Rosuvastatin prescription, 90 pills with multiple refills.  General Health Maintenance -Order Coronary Calcium Score CT heart scan to assess for arterial buildup, given risk factors of diabetes and hyperlipidemia. Patient to decide. -Schedule follow-up appointment for April 18th, 2025.         Orders Placed This Encounter  Procedures   CT CARDIAC SCORING (SELF PAY ONLY)    Standing Status:   Future    Expiration Date:   04/22/2024    Preferred imaging location?:   [facility]    Meds ordered this encounter  Medications   rosuvastatin (CRESTOR) 5 MG tablet    Sig: Take 1 tablet (5 mg total) by mouth daily.    Dispense:  90 tablet    Refill:  3    Follow up plan:  Return in about 12 weeks (around 07/16/2023).   Saralyn Pilar, DO [facility] 04/23/2023, 2:22 PM

## 2023-04-26 ENCOUNTER — Other Ambulatory Visit: Payer: Self-pay | Admitting: Family Medicine

## 2023-04-26 DIAGNOSIS — E785 Hyperlipidemia, unspecified: Secondary | ICD-10-CM

## 2023-04-27 NOTE — Telephone Encounter (Signed)
Requested Prescriptions  Refused Prescriptions Disp Refills   rosuvastatin (CRESTOR) 5 MG tablet [Pharmacy Med Name: ROSUVASTATIN CALCIUM 5 MG TAB] 90 tablet 3    Sig: TAKE 1 TABLET (5 MG TOTAL) BY MOUTH DAILY.     Cardiovascular:  Antilipid - Statins 2 Failed - 04/27/2023  5:15 PM      Failed - Lipid Panel in normal range within the last 12 months    Cholesterol  Date Value Ref Range Status  04/16/2023 143 <200 mg/dL Final   LDL Cholesterol (Calc)  Date Value Ref Range Status  04/16/2023 73 mg/dL (calc) Final    Comment:    Reference range: <100 . Desirable range <100 mg/dL for primary prevention;   <70 mg/dL for patients with CHD or diabetic patients  with > or = 2 CHD risk factors. Marland Kitchen LDL-C is now calculated using the Martin-Hopkins  calculation, which is a validated novel method providing  better accuracy than the Friedewald equation in the  estimation of LDL-C.  Horald Pollen et al. Lenox Ahr. 1610;960(45): 2061-2068  (http://education.[employer].com/faq/FAQ164)    HDL  Date Value Ref Range Status  04/16/2023 41 > OR = 40 mg/dL Final   Triglycerides  Date Value Ref Range Status  04/16/2023 203 (H) <150 mg/dL Final    Comment:    . If a non-fasting specimen was collected, consider repeat triglyceride testing on a fasting specimen if clinically indicated.  Perry Mount et al. J. of Clin. Lipidol. 2015;9:129-169. Marland Kitchen          Passed - Cr in normal range and within 360 days    Creat  Date Value Ref Range Status  04/16/2023 0.81 0.60 - 1.29 mg/dL Final   Creatinine, Urine  Date Value Ref Range Status  04/16/2023 30 20 - 320 mg/dL Final         Passed - Patient is not pregnant      Passed - Valid encounter within last 12 months    Recent Outpatient Visits           4 days ago Type 2 diabetes mellitus with other specified complication, without long-term current use of insulin [facility])   [place] [facility] [place], Netta Neat, DO   3 months  ago Type 2 diabetes mellitus with other specified complication, without long-term current use of insulin [facility])   [place] [facility] [place], Netta Neat, DO   1 year ago Type 2 diabetes mellitus with other specified complication, without long-term current use of insulin [facility])   [place] [facility] [place], Netta Neat, DO   1 year ago Annual physical exam   [place] [facility] [place], Netta Neat, DO   2 years ago Type 2 diabetes mellitus with other specified complication, without long-term current use of insulin [facility])   [place] [facility] [place], Netta Neat, DO       Future Appointments             In 2 months Althea Charon, Netta Neat, DO  [facility]

## 2023-04-29 ENCOUNTER — Ambulatory Visit
Admission: RE | Admit: 2023-04-29 | Discharge: 2023-04-29 | Disposition: A | Discharge status: Home / Self Care | Payer: Self-pay | Source: Ambulatory Visit | Attending: Family Medicine | Admitting: Family Medicine

## 2023-04-29 DIAGNOSIS — E1169 Type 2 diabetes mellitus with other specified complication: Secondary | ICD-10-CM | POA: Insufficient documentation

## 2023-04-29 DIAGNOSIS — E785 Hyperlipidemia, unspecified: Secondary | ICD-10-CM | POA: Insufficient documentation

## 2023-04-30 ENCOUNTER — Encounter: Payer: Self-pay | Admitting: Family Medicine

## 2023-05-07 ENCOUNTER — Other Ambulatory Visit: Payer: Managed Care, Other (non HMO) | Admitting: Pharmacist

## 2023-05-07 DIAGNOSIS — E1169 Type 2 diabetes mellitus with other specified complication: Secondary | ICD-10-CM

## 2023-05-07 DIAGNOSIS — Z7985 Long-term (current) use of injectable non-insulin antidiabetic drugs: Secondary | ICD-10-CM

## 2023-05-07 MED ORDER — MOUNJARO 7.5 MG/0.5ML ~~LOC~~ SOAJ: 7.5000 mg | SUBCUTANEOUS | 1 refills | Status: DC

## 2023-05-07 NOTE — Progress Notes (Signed)
 05/07/2023 Name: Cory Horne MRN: [id number] DOB: 05/02/1975  Chief Complaint  Patient presents with   Medication Management   Medication Adherence    Cory Horne is a 48 y.o. year old male who presented for a telephone visit.   They were referred to the pharmacist by their PCP for assistance in managing diabetes and medication access.      Subjective:   Care Team: Primary Care Provider: Edman Marsa PARAS, DO ; Next Scheduled Visit: 07/16/2023  Medication Access/Adherence  [facility]  [phone] [address], [place] - [address] [place] [address] Phone: [phone] Fax: [phone]  [facility] [address]), [place] - [address] [place]) [place] [address] Phone: [phone] Fax: [phone]   Patient reports affordability concerns with their medications: No  Patient reports access/transportation concerns to their pharmacy: No  Patient reports adherence concerns with their medications:  No       Diabetes:   Current medications:  - Mounjaro  5 mg weekly on Mondays (Note had been out of Mounjo as unexpectedly traveled out of country, but restarted 2/13)             Reports tolerating well   Medications tried in the past: Trulicity , Metformin, Jardiance , Farxiga, Rybelsus    Reports home morning blood sugar readings ranging: 250-310  Patient interested in weight loss; reports current weight ~210 lbs  Reports limiting carbohydrates    Current physical activity: reports walks on and off throughout the day (~9,000-10,000 steps/day)   Statin therapy: rosuvastatin  5 mg daily   Objective:  Lab Results  Component Value Date   HGBA1C 12.3 (H) 04/16/2023    Lab Results  Component Value Date   CREATININE 0.81 04/16/2023   BUN 14 04/16/2023   NA 133 (L) 04/16/2023   K 4.1 04/16/2023   CL 96 (L) 04/16/2023   CO2 28 04/16/2023    Lab Results  Component Value Date    CHOL 143 04/16/2023   HDL 41 04/16/2023   LDLCALC 73 04/16/2023   TRIG 203 (H) 04/16/2023   CHOLHDL 3.5 04/16/2023    Medications Reviewed Today     Reviewed by Alana Sharyle LABOR, RPH-CPP (Pharmacist) on 05/07/23 at 1027  Med List Status: <None>   Medication Order Taking? Sig Documenting Provider Last Dose Status Informant  rosuvastatin  (CRESTOR ) 5 MG tablet [id number] Yes Take 1 tablet (5 mg total) by mouth daily. Edman Marsa PARAS, DO Taking Active   tirzepatide  (MOUNJARO ) 7.5 MG/0.5ML Pen [id number] Yes Inject 7.5 mg into the skin once a week. Edman Marsa PARAS, DO  Active               Assessment/Plan:   Diabetes: - Currently uncontrolled - Reviewed long term cardiovascular and renal outcomes of uncontrolled blood sugar - Reviewed goal A1c, goal fasting, and goal 2 hour post prandial glucose - Reviewed dietary modifications including importance of having regular well-balanced meals and snacks throughout the day, while controlling carbohydrate portion sizes Discuss on focus on lean proteins, non-strachy vegetables, whole grains and increased fiber consumption, adequate hydration - Encourage patient to continue regular physical activity (150 minutes of moderate intensity exercise weekly) - Discuss option to increase Mounjaro  dose for improved blood sugar control. Patient agrees.  Advised patient he may increase to Mounjaro  7.5 mg weekly  CPP sends prescription to pharmacy for patient - Recommend to check glucose, keep log of results and have this record to review at  upcoming medical appointments. Patient to contact provider office sooner if needed for readings outside of established parameters or symptoms  Patient to use blood sugar checks as feedback on dietary choices    Follow Up Plan: Clinical Pharmacist will follow up with patient by telephone on 06/07/2023 at 10:00 AM    Sharyle Sia, PharmD, JAQUELINE, CPP Clinical Pharmacist [facility] [phone]

## 2023-05-07 NOTE — Patient Instructions (Signed)
 Goals Addressed             This Visit's Progress    Pharmacy Goals       The goal A1c is less than 7%. This is the best way to reduce the risk of the long term complications of diabetes, including heart disease, kidney disease, eye disease, strokes, and nerve damage. An A1c of less than 7% corresponds with fasting sugars less than 130 and 2 hour after meal sugars less than 180.   Our goal bad cholesterol, or LDL, is less than 70 . This is why it is important to take your rosuvastatin.   If you have any questions, please reach out to me or the office.   Thank you!   Estelle Grumbles, PharmD, Patsy [place], CPP Clinical Pharmacist [facility] [phone]

## 2023-06-07 ENCOUNTER — Telehealth: Payer: Self-pay

## 2023-06-07 ENCOUNTER — Other Ambulatory Visit: Payer: Managed Care, Other (non HMO) | Admitting: Pharmacist

## 2023-06-07 DIAGNOSIS — E1169 Type 2 diabetes mellitus with other specified complication: Secondary | ICD-10-CM

## 2023-06-07 DIAGNOSIS — Z7985 Long-term (current) use of injectable non-insulin antidiabetic drugs: Secondary | ICD-10-CM

## 2023-06-07 MED ORDER — MOUNJARO 10 MG/0.5ML ~~LOC~~ SOAJ: 10.0000 mg | SUBCUTANEOUS | 0 refills | Status: DC

## 2023-06-07 NOTE — Patient Instructions (Signed)
 Goals Addressed             This Visit's Progress    Pharmacy Goals       The goal A1c is less than 7%. This is the best way to reduce the risk of the long term complications of diabetes, including heart disease, kidney disease, eye disease, strokes, and nerve damage. An A1c of less than 7% corresponds with fasting sugars less than 130 and 2 hour after meal sugars less than 180.   Our goal bad cholesterol, or LDL, is less than 70 . This is why it is important to take your rosuvastatin.   If you have any questions, please reach out to me or the office.   Thank you!   Estelle Grumbles, PharmD, Patsy [place], CPP Clinical Pharmacist [facility] [phone]

## 2023-06-07 NOTE — Progress Notes (Signed)
 06/07/2023 Name: Cory Horne MRN: [id number] DOB: [birth year]  Chief Complaint  Patient presents with   Medication Management    Cory Horne is a 48 y.o. year old male who presented for a telephone visit.   They were referred to the pharmacist by their PCP for assistance in managing diabetes and medication access.      Subjective:   Care Team: Primary Care Provider: Smitty Cords, DO ; Next Scheduled Visit: 07/16/2023  Medication Access/Adherence  [facility]  [phone] [address], [place] - [address] [place] [address] Phone: [phone] Fax: [phone]  [id number] [address]), [place] - [address] [place]) [place] [address] Phone: [phone] Fax: [phone]   Patient reports affordability concerns with their medications: No  Patient reports access/transportation concerns to their pharmacy: No  Patient reports adherence concerns with their medications:  No       Diabetes:   Current medications:  - Mounjaro 7.5 mg weekly on Saturdays (increased to current dose on 05/08/2023)             Reports tolerating well; noticing positive impact on appetite   Medications tried in the past: Trulicity, Metformin, Jardiance, Farxiga, Rybelsus   Reports home morning blood sugar readings ranging: 230-240s   Patient interested in weight loss; denies weighing himself but noticing some change in fit of clothing   Breakfast: Crackers and peanuts Lunch: Chicken with vegetables or beef and vegetable soup Supper: Chicken with vegetables or beef and vegetable soup Snacks: rare Drinks: water and rarely ginger ale zero sugar  Current physical activity: reports walks on and off throughout the day (~9,000-10,000 steps/day)   Statin therapy: rosuvastatin 5 mg daily   Objective:  Lab Results  Component Value Date   HGBA1C 12.3 (H) 04/16/2023    Lab Results  Component  Value Date   CREATININE 0.81 04/16/2023   BUN 14 04/16/2023   NA 133 (L) 04/16/2023   K 4.1 04/16/2023   CL 96 (L) 04/16/2023   CO2 28 04/16/2023    Lab Results  Component Value Date   CHOL 143 04/16/2023   HDL 41 04/16/2023   LDLCALC 73 04/16/2023   TRIG 203 (H) 04/16/2023   CHOLHDL 3.5 04/16/2023    Medications Reviewed Today     Reviewed by Manuela Neptune, RPH-CPP (Pharmacist) on 06/07/23 at 1014  Med List Status: <None>   Medication Order Taking? Sig Documenting Provider Last Dose Status Informant  rosuvastatin (CRESTOR) 5 MG tablet [id number]  Take 1 tablet (5 mg total) by mouth daily. Karamalegos, Netta Neat, DO  Active   tirzepatide [facility]) 7.5 MG/0.5ML Pen [id number] Yes Inject 7.5 mg into the skin once a week. Smitty Cords, DO Taking Active               Assessment/Plan:   Diabetes: - Currently uncontrolled - Reviewed long term cardiovascular and renal outcomes of uncontrolled blood sugar - Reviewed goal A1c, goal fasting, and goal 2 hour post prandial glucose - Reviewed dietary modifications including importance of having regular well-balanced meals and snacks throughout the day, while controlling carbohydrate portion sizes Discuss on focus on lean proteins, non-strachy vegetables, whole grains and increased fiber consumption, adequate hydration - Encourage patient to continue regular physical activity (150 minutes of moderate intensity exercise weekly) - Discuss option to increase Mounjaro dose for improved blood sugar control. Patient requests to increase dose of Mounjaro Advised patient he  may increase to Mounjaro 10 mg weekly (plans to increase on 07/03/2023, after uses up current supply of Mounjaro 7.5 mg weekly)             CPP sends prescription to pharmacy for patient - Recommend to check glucose, keep log of results and have this record to review at upcoming medical appointments. Patient to contact provider office sooner if needed for  readings outside of established parameters or symptoms             Patient to use blood sugar checks as feedback on dietary choices     Follow Up Plan: Clinical Pharmacist will follow up with patient by telephone on 08/06/2023 at 10:00 AM    Estelle Grumbles, PharmD, P[place]y [place], CPP Clinical Pharmacist [facility] [phone]

## 2023-06-07 NOTE — Telephone Encounter (Signed)
 Cory Horne (KeyNadene Rubins) Rx #: [id number] Need Help? Call us at [phone] Outcome Additional Information Required Clinical Override not needed Drug Mounjaro 10MG /0.5ML auto-injectors ePA cloud Pension scheme manager [place] Form [phone] NCPDP) Original Claim Info [phone]

## 2023-06-28 ENCOUNTER — Other Ambulatory Visit: Payer: Self-pay | Admitting: Family Medicine

## 2023-06-28 DIAGNOSIS — E1169 Type 2 diabetes mellitus with other specified complication: Secondary | ICD-10-CM

## 2023-06-29 MED ORDER — MOUNJARO 10 MG/0.5ML ~~LOC~~ SOAJ: 10.0000 mg | SUBCUTANEOUS | 2 refills | Status: DC

## 2023-06-29 NOTE — Telephone Encounter (Signed)
 Requested medication (s) are due for refill today - no  Requested medication (s) are on the active medication list -yes  Future visit scheduled -yes  Last refill: 06/07/23 2ml- change in dosage  Notes to clinic: off protocol- provider review, dosing requested is no longer current dosing   Requested Prescriptions  Pending Prescriptions Disp Refills   MOUNJARO 7.5 MG/0.5ML Pen [Pharmacy Med Name: Mounjaro 7.5 MG/0.5ML Subcutaneous Solution Pen-injector] 4 mL 0    Sig: INJECT 7.5MG  SUBCUTANEOUSLY  ONCE A WEEK     Off-Protocol Failed - 06/29/2023 12:23 PM      Failed - Medication not assigned to a protocol, review manually.      Failed - Valid encounter within last 12 months    Recent Outpatient Visits   None     Future Appointments             In 2 weeks Althea Charon, Netta Neat, DO [facility], [facility]               Requested Prescriptions  Pending Prescriptions Disp Refills   MOUNJARO 7.5 MG/0.5ML Pen [Pharmacy Med Name: Mounjaro 7.5 MG/0.5ML Subcutaneous Solution Pen-injector] 4 mL 0    Sig: INJECT 7.5MG  SUBCUTANEOUSLY  ONCE A WEEK     Off-Protocol Failed - 06/29/2023 12:23 PM      Failed - Medication not assigned to a protocol, review manually.      Failed - Valid encounter within last 12 months    Recent Outpatient Visits   None     Future Appointments             In 2 weeks Althea Charon, Netta Neat, DO [facility], [facility]

## 2023-07-16 ENCOUNTER — Ambulatory Visit: Payer: Self-pay | Admitting: Family Medicine

## 2023-07-23 ENCOUNTER — Encounter: Payer: Self-pay | Admitting: Family Medicine

## 2023-07-23 ENCOUNTER — Ambulatory Visit: Admitting: Family Medicine

## 2023-07-23 VITALS — BP 100/62 | HR 79 | Resp 18 | Ht 68.0 in | Wt 202.4 lb

## 2023-07-23 DIAGNOSIS — E785 Hyperlipidemia, unspecified: Secondary | ICD-10-CM

## 2023-07-23 DIAGNOSIS — E1169 Type 2 diabetes mellitus with other specified complication: Secondary | ICD-10-CM | POA: Diagnosis not present

## 2023-07-23 DIAGNOSIS — Z7985 Long-term (current) use of injectable non-insulin antidiabetic drugs: Secondary | ICD-10-CM | POA: Diagnosis not present

## 2023-07-23 DIAGNOSIS — E66811 Obesity, class 1: Secondary | ICD-10-CM

## 2023-07-23 LAB — POCT GLYCOSYLATED HEMOGLOBIN (HGB A1C): Hemoglobin A1C: 8.7 % — AB (ref 4.0–5.6)

## 2023-07-23 MED ORDER — MOUNJARO 10 MG/0.5ML ~~LOC~~ SOAJ: 10.0000 mg | SUBCUTANEOUS | 3 refills | Status: DC

## 2023-07-23 NOTE — Progress Notes (Signed)
 Subjective:    Patient ID: Cory Horne, male    DOB: 1976/01/27, 48 y.o.   MRN: [id number]  Cory Horne is a 48 y.o. male presenting on 07/23/2023 for Diabetes (3 month fu )   HPI  Discussed the use of AI scribe software for clinical note transcription with the patient, who gave verbal consent to proceed.  History of Present Illness   Cory Horne is a 48 year old male with type 2 diabetes who presents for follow-up on his diabetes management.  Type 2 Diabetes  He has experienced a significant improvement in his diabetes management, with his HbA1c decreasing from 12.3% to 8.7% over the past three months. This improvement is attributed to his current medication regimen, which includes Mounjaro at a dose of 10 mg, started earlier this month. He is about to take his fourth dose. He previously used Jardiance  but is currently not taking it. He feels positive about his progress.  His weight has decreased slightly from 206-207 pounds to 202 pounds. Weight loss has always been challenging, typically maintaining a weight between 200 and 210 pounds.  He mentions that his blood pressure and other vital signs are stable. He plans to travel to [place] for two weeks, which may affect his medication schedule. He works a regular schedule and prefers medical appointments on Fridays to avoid missing work.  He discusses concerns about fertility, noting that he and his wife have been trying to conceive without success. He has three children from a previous relationship and has undergone semen analysis in [place]. He is currently engaged with a fertility program but is unsure if his diabetes might be affecting his fertility.   HYPERLIPIDEMIA:  He inquires about the impact of missing doses of his cholesterol medication over a weekend. No symptoms of illness despite his diabetes, and he does not feel any physical changes related to his cholesterol medication.   - Currently taking Rosuvastatin  5mg  daily,  tolerating well without side effects or myalgias      07/23/2023    3:32 PM 01/22/2023    7:09 PM 05/23/2021    8:38 AM  Depression screen PHQ 2/9  Decreased Interest 0 0 0  Down, Depressed, Hopeless 0 0 0  PHQ - 2 Score 0 0 0  Altered sleeping 0  0  Tired, decreased energy 0  0  Change in appetite 0  0  Feeling bad or failure about yourself  0  0  Trouble concentrating 0  0  Moving slowly or fidgety/restless 0  0  Suicidal thoughts 0  0  PHQ-9 Score 0  0  Difficult doing work/chores Not difficult at all  Not difficult at all       07/23/2023    3:32 PM 05/23/2021    8:38 AM 10/11/2020    1:46 PM  GAD 7 : Generalized Anxiety Score  Nervous, Anxious, on Edge 0 0 0  Control/stop worrying 0 0 0  Worry too much - different things 0 0 0  Trouble relaxing 0 0 0  Restless 0 0 0  Easily annoyed or irritable 0 0 0  Afraid - awful might happen 0 0 0  Total GAD 7 Score 0 0 0  Anxiety Difficulty Not difficult at all Not difficult at all Not difficult at all    Social History   Tobacco Use   Smoking status: Former    Current packs/day: 0.00    Average packs/day: 2.0 packs/day for 26.0 years (52.0 ttl pk-yrs)  Types: Cigarettes    Start date: 59    Quit date: 2018    Years since quitting: 7.3   Smokeless tobacco: Never  Substance Use Topics   Alcohol use: Yes    Comment: occ    Review of Systems Per HPI unless specifically indicated above     Objective:    BP 100/62 (BP Location: Right Arm, Patient Position: Sitting, Cuff Size: Normal)   Pulse 79   Resp 18   Ht 5\' 8"  (1.727 m)   Wt 202 lb 6.4 oz (91.8 kg)   SpO2 99%   BMI 30.77 kg/m   Wt Readings from Last 3 Encounters:  07/23/23 202 lb 6.4 oz (91.8 kg)  04/23/23 206 lb (93.4 kg)  01/22/23 207 lb (93.9 kg)    Physical Exam Vitals and nursing note reviewed.  Constitutional:      General: He is not in acute distress.    Appearance: Normal appearance. He is well-developed. He is not diaphoretic.      Comments: Well-appearing, comfortable, cooperative  HENT:     Head: Normocephalic and atraumatic.  Eyes:     General:        Right eye: No discharge.        Left eye: No discharge.     Conjunctiva/sclera: Conjunctivae normal.  Cardiovascular:     Rate and Rhythm: Normal rate.  Pulmonary:     Effort: Pulmonary effort is normal.  Skin:    General: Skin is warm and dry.     Findings: No erythema or rash.  Neurological:     Mental Status: He is alert and oriented to person, place, and time.  Psychiatric:        Mood and Affect: Mood normal.        Behavior: Behavior normal.        Thought Content: Thought content normal.     Comments: Well groomed, good eye contact, normal speech and thoughts     Results for orders placed or performed in visit on 07/23/23  POCT HgB A1C   Collection Time: 07/23/23  3:37 PM  Result Value Ref Range   Hemoglobin A1C 8.7 (A) 4.0 - 5.6 %   HbA1c POC (<> result, manual entry)     HbA1c, POC (prediabetic range)     HbA1c, POC (controlled diabetic range)        Assessment & Plan:   Problem List Items Addressed This Visit     Hyperlipidemia associated with type 2 diabetes mellitus (HCC)   Relevant Medications   MOUNJARO 10 MG/0.5ML Pen   Obesity (BMI 30.0-34.9)   Type 2 diabetes mellitus with other specified complication (HCC) - Primary   Relevant Medications   MOUNJARO 10 MG/0.5ML Pen   Other Relevant Orders   POCT HgB A1C (Completed)   Other Visit Diagnoses       Long-term current use of injectable noninsulin antidiabetic medication            Type 2 diabetes mellitus Significant improvement in glycemic control with Mounjaro 10 mg. Hemoglobin A1c decreased from 12.3% to 8.7%. No immediate need for Jardiance  at this time. Followed by Clinical Pharmacist Plan to continue current regimen to assess full effect. - Continue Mounjaro 10 mg weekly. Refills added - Reassess A1c in three months. Consider dose inc Mounjaro 12.5 vs add Jardiance   or maintain  Mounjaro 10  Obesity BMI >30 Slight weight reduction from 206-207 lbs to 202 lbs. Progressing with lifestyle modifications and Mounjaro. - Continue  Mounjaro and lifestyle modifications. - Monitor weight and reassess in three months.  Hyperlipidemia Managed with cholesterol medication. Occasional missed doses on weekends. - Continue current cholesterol medication regimen. - Educated on importance of consistent daily intake.       Orders Placed This Encounter  Procedures   POCT HgB A1C    Meds ordered this encounter  Medications   MOUNJARO 10 MG/0.5ML Pen    Sig: Inject 10 mg into the skin once a week.    Dispense:  2 mL    Refill:  3    Add refills for future    Follow up plan: Return in about 15 weeks (around 11/05/2023) for 3 month DM A1c 8/8 340-4pm.   Domingo Friend, DO [facility] 07/23/2023, 3:40 PM

## 2023-07-23 NOTE — Patient Instructions (Addendum)
 Thank you for coming to the office today.  Recent Labs    01/22/23 1643 04/16/23 1333 07/23/23 1537  HGBA1C 12.2* 12.3* 8.7*     Please schedule a Follow-up Appointment to: Return in about 15 weeks (around 11/05/2023) for 3 month DM A1c 8/8 340-4pm.  If you have any other questions or concerns, please feel free to call the office or send a message through MyChart. You may also schedule an earlier appointment if necessary.  Additionally, you may be receiving a survey about your experience at our office within a few days to 1 week by e-mail or mail. We value your feedback.  Domingo Friend, DO [facility], [place]

## 2023-08-06 ENCOUNTER — Other Ambulatory Visit: Admitting: Pharmacist

## 2023-08-06 DIAGNOSIS — Z7985 Long-term (current) use of injectable non-insulin antidiabetic drugs: Secondary | ICD-10-CM

## 2023-08-06 DIAGNOSIS — E1169 Type 2 diabetes mellitus with other specified complication: Secondary | ICD-10-CM

## 2023-08-06 NOTE — Patient Instructions (Signed)
 Goals Addressed             This Visit's Progress    Pharmacy Goals       The goal A1c is less than 7%. This is the best way to reduce the risk of the long term complications of diabetes, including heart disease, kidney disease, eye disease, strokes, and nerve damage. An A1c of less than 7% corresponds with fasting sugars less than 130 and 2 hour after meal sugars less than 180.   Our goal bad cholesterol, or LDL, is less than 70 . This is why it is important to take your rosuvastatin.   If you have any questions, please reach out to me or the office.   Thank you!   Estelle Grumbles, PharmD, Patsy [place], CPP Clinical Pharmacist [facility] [phone]

## 2023-08-06 NOTE — Progress Notes (Signed)
 08/06/2023 Name: Kirsten Lafarga MRN: [id number] DOB: [birth year]  Chief Complaint  Patient presents with   Medication Management    Katie Schowalter is a 48 y.o. year old male who presented for a telephone visit.   They were referred to the pharmacist by their PCP for assistance in managing diabetes and medication access.      Subjective:   Care Team: Primary Care Provider: Raina Bunting, DO ; Next Scheduled Visit: 11/05/2023  Medication Access/Adherence  Current Pharmacy:  [facility] [address] ([address]), [place] - [address] [place] ([place]) [place] [address] Phone: [phone] Fax: [phone]   Patient reports affordability concerns with their medications: No  Patient reports access/transportation concerns to their pharmacy: No  Patient reports adherence concerns with their medications:  No       Diabetes:   Current medications:  - Mounjaro 10 mg weekly on Saturdays (increased to current dose ~06/30/2023)             Reports tolerating well; noticing positive impact on appetite   Medications tried in the past: Trulicity , Metformin, Jardiance , Farxiga, Rybelsus    Reports home blood sugar readings ranging: 130-170   Patient interested in weight loss; not currently noticed much change in appetite   Current physical activity: reports walks on and off throughout the day (~9,000-10,000 steps/day)   Statin therapy: rosuvastatin  5 mg daily   Objective:  Lab Results  Component Value Date   HGBA1C 8.7 (A) 07/23/2023    Lab Results  Component Value Date   CREATININE 0.81 04/16/2023   BUN 14 04/16/2023   NA 133 (L) 04/16/2023   K 4.1 04/16/2023   CL 96 (L) 04/16/2023   CO2 28 04/16/2023    Lab Results  Component Value Date   CHOL 143 04/16/2023   HDL 41 04/16/2023   LDLCALC 73 04/16/2023   TRIG 203 (H) 04/16/2023   CHOLHDL 3.5 04/16/2023    Medications Reviewed Today     Reviewed by Ardis Becton,  RPH-CPP (Pharmacist) on 08/06/23 at 1016  Med List Status: <None>   Medication Order Taking? Sig Documenting Provider Last Dose Status Informant  MOUNJARO 10 MG/0.5ML Pen [id number] Yes Inject 10 mg into the skin once a week. Raina Bunting, DO Taking Active   rosuvastatin  (CRESTOR ) 5 MG tablet [id number]  Take 1 tablet (5 mg total) by mouth daily. Raina Bunting, DO  Active               Assessment/Plan:   Diabetes: - Currently uncontrolled based on latest A1C result - Reviewed long term cardiovascular and renal outcomes of uncontrolled blood sugar - Reviewed goal A1c, goal fasting, and goal 2 hour post prandial glucose - Reviewed dietary modifications including importance of having regular well-balanced meals and snacks throughout the day, while controlling carbohydrate portion sizes Discuss on focus on lean proteins, non-strachy vegetables, whole grains and increased fiber consumption, adequate hydration - Encourage patient to continue regular physical activity (150 minutes of moderate intensity exercise weekly) - Recommend to check glucose, keep log of results and have this record to review at upcoming medical appointments. Patient to contact provider office sooner if needed for readings outside of established parameters or symptoms             Patient to use blood sugar checks as feedback on dietary choices - Patient prefers to continue current dose of Mounjaro for now, but will discuss possible dose adjustment with PCP at upcoming  appointment in August    Follow Up Plan:   Patient denies further medication questions or concerns today Provide patient with contact information for clinic pharmacist to contact if needed in future for medication questions/concerns    Arthur Lash, PharmD, Becky Bowels, CPP Clinical Pharmacist [facility] [phone]

## 2023-11-05 ENCOUNTER — Ambulatory Visit: Admitting: Family Medicine

## 2023-12-03 ENCOUNTER — Ambulatory Visit: Admitting: Family Medicine

## 2023-12-03 ENCOUNTER — Encounter: Payer: Self-pay | Admitting: Family Medicine

## 2023-12-03 VITALS — BP 122/78 | HR 76 | Ht 68.0 in | Wt 206.0 lb

## 2023-12-03 DIAGNOSIS — E1169 Type 2 diabetes mellitus with other specified complication: Secondary | ICD-10-CM

## 2023-12-03 DIAGNOSIS — Z7985 Long-term (current) use of injectable non-insulin antidiabetic drugs: Secondary | ICD-10-CM

## 2023-12-03 LAB — POCT GLYCOSYLATED HEMOGLOBIN (HGB A1C): Hemoglobin A1C: 8.5 % — AB (ref 4.0–5.6)

## 2023-12-03 MED ORDER — MOUNJARO 12.5 MG/0.5ML ~~LOC~~ SOAJ: 12.5000 mg | SUBCUTANEOUS | 3 refills | Status: DC

## 2023-12-03 NOTE — Progress Notes (Signed)
 Subjective:    Patient ID: Cory Horne, male    DOB: [birth year], 48 y.o.   MRN: [id number]  Cory Horne is a 48 y.o. male presenting on 12/03/2023 for Diabetes   HPI  Discussed the use of AI scribe software for clinical note transcription with the patient, who gave verbal consent to proceed.  History of Present Illness   Cory Horne is a 48 year old male with diabetes who presents for a follow-up on blood sugar management.  Type 2 Diabetes Hyperglycemia and glycemic control - Hemoglobin A1c is 8.5%. Past history 12 - Morning blood glucose levels range from 150 to 180 mg/dL. - Occasional postprandial spikes up to 260 mg/dL. - Lowest blood glucose readings are around 130 mg/dL. - Improvement in blood sugar levels compared to previous measurements.  Antidiabetic medication management - Currently taking Mounjaro . 10mg  weekly, interested in dose inc - Tolerates medication well without side effects. - Medication cost is $25 per month without financial difficulty. - Last dose taken on Saturday; due for a refill. - Plans to manage medication schedule to avoid running out during travel to [place] in early January.  Visual disturbance - Blurry vision in the right eye associated with elevated blood glucose levels. 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  Laboratory testing preferences - Prefers to have blood work performed early in the morning on a Friday.          12/03/2023    3:44 PM 07/23/2023    3:32 PM 01/22/2023    7:09 PM  Depression screen PHQ 2/9  Decreased Interest 0 0 0  Down, Depressed, Hopeless 0 0 0  PHQ - 2 Score 0 0 0  Altered sleeping  0   Tired, decreased energy  0   Change in appetite  0   Feeling bad or failure about yourself   0   Trouble concentrating  0   Moving slowly or fidgety/restless  0   Suicidal thoughts  0   PHQ-9 Score  0   Difficult doing work/chores  Not difficult at all         12/03/2023    3:44 PM 07/23/2023    3:32 PM 05/23/2021    8:38 AM 10/11/2020    1:46 PM  GAD 7 : Generalized Anxiety Score  Nervous, Anxious, on Edge 0 0 0 0  Control/stop worrying 0 0 0 0  Worry too much - different things 0 0 0 0  Trouble relaxing 0 0 0 0  Restless 0 0 0 0  Easily annoyed or irritable 0 0 0 0  Afraid - awful might happen 0 0 0 0  Total GAD 7 Score 0 0 0 0  Anxiety Difficulty  Not difficult at all Not difficult at all Not difficult at all    Social History   Tobacco Use   Smoking status: Former    Current packs/day: 0.00    Average packs/day: 2.0 packs/day for 26.0 years (52.0 ttl pk-yrs)    Types: Cigarettes    Start date: 20    Quit date: 2018    Years since quitting: 7.6   Smokeless tobacco: Never  Substance Use Topics   Alcohol use: Yes    Comment: occ    Review of Systems Per HPI unless specifically indicated above     Objective:    BP 122/78 (BP Location: Left Arm, Patient Position: Sitting, Cuff Size: Normal)  Pulse 76   Ht 5' 8 (1.727 m)   Wt 206 lb (93.4 kg)   SpO2 96%   BMI 31.32 kg/m   Wt Readings from Last 3 Encounters:  12/03/23 206 lb (93.4 kg)  07/23/23 202 lb 6.4 oz (91.8 kg)  04/23/23 206 lb (93.4 kg)    Physical Exam Vitals and nursing note reviewed.  Constitutional:      General: He is not in acute distress.    Appearance: He is well-developed. He is not diaphoretic.     Comments: Well-appearing, comfortable, cooperative  HENT:     Head: Normocephalic and atraumatic.  Eyes:     General:        Right eye: No discharge.        Left eye: No discharge.     Conjunctiva/sclera: Conjunctivae normal.  Neck:     Thyroid: No thyromegaly.  Cardiovascular:     Rate and Rhythm: Normal rate and regular rhythm.     Pulses: Normal pulses.     Heart sounds: Normal heart sounds. No murmur heard. Pulmonary:     Effort: Pulmonary effort is normal. No respiratory distress.     Breath sounds: Normal breath sounds. No  wheezing or rales.  Musculoskeletal:        General: Normal range of motion.     Cervical back: Normal range of motion and neck supple.  Lymphadenopathy:     Cervical: No cervical adenopathy.  Skin:    General: Skin is warm and dry.     Findings: No erythema or rash.  Neurological:     Mental Status: He is alert and oriented to person, place, and time. Mental status is at baseline.  Psychiatric:        Behavior: Behavior normal.     Comments: Well groomed, good eye contact, normal speech and thoughts     Results for orders placed or performed in visit on 12/03/23  POCT HgB A1C   Collection Time: 12/03/23  3:50 PM  Result Value Ref Range   Hemoglobin A1C 8.5 (A) 4.0 - 5.6 %   HbA1c POC (<> result, manual entry)     HbA1c, POC (prediabetic range)     HbA1c, POC (controlled diabetic range)        Assessment & Plan:   Problem List Items Addressed This Visit     Type 2 diabetes mellitus with other specified complication (HCC) - Primary   Relevant Medications   MOUNJARO  12.5 MG/0.5ML Pen   Other Relevant Orders   POCT HgB A1C (Completed)   Other Visit Diagnoses       Long-term current use of injectable noninsulin antidiabetic medication            Type 2 diabetes mellitus with hyperglycemia-associated blurry vision Type 2 diabetes with hyperglycemia, managed with Mounjaro . Hemoglobin A1c improved to 8.5% but not at target. Fasting glucose 130-180 mg/dL, occasional postprandial spikes to 260 mg/dL. Blurry vision with hyperglycemia Tolerates medication well, cost manageable. - Increase Mounjaro  to 12.5 mg. From 10mg . Considered Jardiance  add however prefer to avoid additional cost /medication - Order Mounjaro  with refills for one month. - Schedule follow-up in January for exam and blood work.          Orders Placed This Encounter  Procedures   POCT HgB A1C    Meds ordered this encounter  Medications   MOUNJARO  12.5 MG/0.5ML Pen    Sig: Inject 12.5 mg into the  skin once a week.    Dispense:  2 mL    Refill:  3    Dose increase from 10 to 12.5mg     Follow up plan: Return in about 4 months (around 04/03/2024) for 4.5 month fasting lab > 1 week later Annual Physical.  Future labs ordered for 04/21/24   Marsa Officer, DO [facility] 12/03/2023, 4:03 PM

## 2023-12-03 NOTE — Patient Instructions (Addendum)
 Thank you for coming to the office today.  Dose increase Mounjaro  from 10 to 12.5mg   DUE for FASTING BLOOD WORK (no food or drink after midnight before the lab appointment, only water or coffee without cream/sugar on the morning of)  SCHEDULE Lab Only visit in the morning at the clinic for lab draw in 4.5 MONTHS   - Make sure Lab Only appointment is at about 1 week before your next appointment, so that results will be available  For Lab Results, once available within 2-3 days of blood draw, you can can log in to MyChart online to view your results and a brief explanation. Also, we can discuss results at next follow-up visit.    Please schedule a Follow-up Appointment to: Return in about 4 months (around 04/03/2024) for 4.5 month fasting lab > 1 week later Annual Physical.  If you have any other questions or concerns, please feel free to call the office or send a message through MyChart. You may also schedule an earlier appointment if necessary.  Additionally, you may be receiving a survey about your experience at our office within a few days to 1 week by e-mail or mail. We value your feedback.  Marsa Officer, DO [facility], [place]

## 2023-12-04 ENCOUNTER — Other Ambulatory Visit: Payer: Self-pay | Admitting: Family Medicine

## 2023-12-04 DIAGNOSIS — Z125 Encounter for screening for malignant neoplasm of prostate: Secondary | ICD-10-CM

## 2023-12-04 DIAGNOSIS — Z0000 Encounter for general adult medical examination without abnormal findings: Secondary | ICD-10-CM

## 2023-12-04 DIAGNOSIS — E66811 Obesity, class 1: Secondary | ICD-10-CM

## 2023-12-04 DIAGNOSIS — E1169 Type 2 diabetes mellitus with other specified complication: Secondary | ICD-10-CM

## 2023-12-10 ENCOUNTER — Other Ambulatory Visit (HOSPITAL_COMMUNITY): Payer: Self-pay

## 2024-03-28 ENCOUNTER — Other Ambulatory Visit: Payer: Self-pay | Admitting: Family Medicine

## 2024-03-28 DIAGNOSIS — E1169 Type 2 diabetes mellitus with other specified complication: Secondary | ICD-10-CM

## 2024-03-29 NOTE — Telephone Encounter (Signed)
 Requested medication (s) are due for refill today: yes  Requested medication (s) are on the active medication list: yes  Last refill:  12/03/23  Future visit scheduled: yes  Notes to clinic:   Medication not assigned to a protocol, review manually.      Requested Prescriptions  Pending Prescriptions Disp Refills   MOUNJARO  12.5 MG/0.5ML Pen [Pharmacy Med Name: Mounjaro  12.5 MG/0.5ML Subcutaneous Solution Auto-injector] 4 mL 0    Sig: INJECT 12.5MG  SUBCUTANEOUSLY  ONCE A WEEK     Off-Protocol Failed - 03/29/2024  2:09 PM      Failed - Medication not assigned to a protocol, review manually.      Passed - Valid encounter within last 12 months    Recent Outpatient Visits           3 months ago Type 2 diabetes mellitus with other specified complication, without long-term current use of insulin [facility])   [place] [facility] Daniel, Marsa PARAS, DO   8 months ago Type 2 diabetes mellitus with other specified complication, without long-term current use of insulin [facility])   [place] [facility] Mark, Marsa PARAS, [place]

## 2024-03-31 ENCOUNTER — Telehealth: Payer: Self-pay

## 2024-03-31 ENCOUNTER — Other Ambulatory Visit (HOSPITAL_COMMUNITY): Payer: Self-pay

## 2024-03-31 NOTE — Telephone Encounter (Signed)
 Pharmacy Patient Advocate Encounter   Received notification from Onbase that prior authorization for Mounjaro  12.5 is required/requested.   Insurance verification completed.   The patient is insured through [employer].   Per test claim: PA required; PA submitted to above mentioned insurance via Latent Key/confirmation #/EOC [id number] Status is pending

## 2024-04-07 ENCOUNTER — Other Ambulatory Visit (HOSPITAL_COMMUNITY): Payer: Self-pay

## 2024-04-07 NOTE — Telephone Encounter (Signed)
 Pharmacy Patient Advocate Encounter  Received notification from [employer] that Prior Authorization for Mounjaro  12.5 has been APPROVED from 03/31/24 to 04/03/25. Unable to obtain price due to refill too soon rejection, last fill date 03/30/24 next available fill date1/15/26   PA #/Case ID/Reference #: # [id number]

## 2024-04-12 ENCOUNTER — Other Ambulatory Visit

## 2024-04-14 ENCOUNTER — Other Ambulatory Visit: Payer: Self-pay

## 2024-04-21 ENCOUNTER — Other Ambulatory Visit

## 2024-04-21 DIAGNOSIS — Z0000 Encounter for general adult medical examination without abnormal findings: Secondary | ICD-10-CM

## 2024-04-21 DIAGNOSIS — E66811 Obesity, class 1: Secondary | ICD-10-CM

## 2024-04-21 DIAGNOSIS — E1169 Type 2 diabetes mellitus with other specified complication: Secondary | ICD-10-CM

## 2024-04-21 DIAGNOSIS — Z125 Encounter for screening for malignant neoplasm of prostate: Secondary | ICD-10-CM

## 2024-04-28 ENCOUNTER — Encounter: Admitting: Family Medicine

## 2024-05-01 ENCOUNTER — Other Ambulatory Visit: Payer: Self-pay | Admitting: Family Medicine

## 2024-05-01 DIAGNOSIS — E1169 Type 2 diabetes mellitus with other specified complication: Secondary | ICD-10-CM

## 2024-05-02 NOTE — Telephone Encounter (Signed)
 Requested medications are due for refill today.  yes  Requested medications are on the active medications list.  yes  Last refill. 04/23/2023 #90 3 rf  Future visit scheduled.   yes  Notes to clinic.  Expired labs.    Requested Prescriptions  Pending Prescriptions Disp Refills   rosuvastatin  (CRESTOR ) 5 MG tablet [Pharmacy Med Name: Rosuvastatin  Calcium  5 MG Oral Tablet] 90 tablet 0    Sig: Take 1 tablet by mouth once daily     Cardiovascular:  Antilipid - Statins 2 Failed - 05/02/2024 12:52 PM      Failed - Cr in normal range and within 360 days    Creat  Date Value Ref Range Status  04/16/2023 0.81 0.60 - 1.29 mg/dL Final   Creatinine, Urine  Date Value Ref Range Status  04/16/2023 30 20 - 320 mg/dL Final         Failed - Lipid Panel in normal range within the last 12 months    Cholesterol  Date Value Ref Range Status  04/16/2023 143 <200 mg/dL Final   LDL Cholesterol (Calc)  Date Value Ref Range Status  04/16/2023 73 mg/dL (calc) Final    Comment:    Reference range: <100 . Desirable range <100 mg/dL for primary prevention;   <70 mg/dL for patients with CHD or diabetic patients  with > or = 2 CHD risk factors. [facility] LDL-C is now calculated using the Martin-Hopkins  calculation, which is a validated novel method providing  better accuracy than the Friedewald equation in the  estimation of LDL-C.  Gladis APPLETHWAITE et al. SANDREA. 7986;689(80): 2061-2068  (http://education.[facility].com/faq/FAQ164)    HDL  Date Value Ref Range Status  04/16/2023 41 > OR = 40 mg/dL Final   Triglycerides  Date Value Ref Range Status  04/16/2023 203 (H) <150 mg/dL Final    Comment:    . If a non-fasting specimen was collected, consider repeat triglyceride testing on a fasting specimen if clinically indicated.  Veatrice et al. J. of Clin. Lipidol. 2015;9:129-169. [facility]          Passed - Patient is not pregnant      Passed - Valid encounter within last 12 months    Recent  Outpatient Visits           5 months ago Type 2 diabetes mellitus with other specified complication, without long-term current use of insulin [facility])   [place] [facility] [place], Marsa PARAS, DO   9 months ago Type 2 diabetes mellitus with other specified complication, without long-term current use of insulin [facility])   [place] [facility] [place], Marsa PARAS, [place]

## 2024-05-15 ENCOUNTER — Other Ambulatory Visit

## 2024-05-19 ENCOUNTER — Encounter: Admitting: Family Medicine
